# Patient Record
Sex: Male | Born: 1955 | Race: White | Hispanic: No | Marital: Single | Smoking: Never smoker
Health system: Southern US, Academic
[De-identification: ages and names within clinical notes are randomized; demographics above are authoritative.]

## PROBLEM LIST (undated history)

## (undated) DIAGNOSIS — I4891 Unspecified atrial fibrillation: Secondary | ICD-10-CM

## (undated) DIAGNOSIS — M069 Rheumatoid arthritis, unspecified: Secondary | ICD-10-CM

## (undated) DIAGNOSIS — I10 Essential (primary) hypertension: Secondary | ICD-10-CM

## (undated) DIAGNOSIS — M549 Dorsalgia, unspecified: Secondary | ICD-10-CM

## (undated) DIAGNOSIS — G8929 Other chronic pain: Secondary | ICD-10-CM

## (undated) DIAGNOSIS — M1990 Unspecified osteoarthritis, unspecified site: Secondary | ICD-10-CM

## (undated) HISTORY — PX: TONSILLECTOMY: SUR1361

## (undated) HISTORY — PX: KNEE SURGERY: SHX244

---

## 2007-03-20 ENCOUNTER — Encounter: Payer: Self-pay | Admitting: Internal Medicine

## 2007-03-20 ENCOUNTER — Ambulatory Visit: Payer: Self-pay | Admitting: Internal Medicine

## 2007-03-20 DIAGNOSIS — F528 Other sexual dysfunction not due to a substance or known physiological condition: Secondary | ICD-10-CM | POA: Insufficient documentation

## 2007-03-20 DIAGNOSIS — I10 Essential (primary) hypertension: Secondary | ICD-10-CM | POA: Insufficient documentation

## 2007-03-20 DIAGNOSIS — E785 Hyperlipidemia, unspecified: Secondary | ICD-10-CM | POA: Insufficient documentation

## 2007-03-20 LAB — CONVERTED CEMR LAB
ALT: 23 units/L (ref 0–53)
AST: 36 units/L (ref 0–37)
Basophils Relative: 0.2 % (ref 0.0–1.0)
Bilirubin, Direct: 0.1 mg/dL (ref 0.0–0.3)
CO2: 29 meq/L (ref 19–32)
Calcium: 9.3 mg/dL (ref 8.4–10.5)
Eosinophils Relative: 0.1 % (ref 0.0–5.0)
GFR calc Af Amer: 114 mL/min
Glucose, Bld: 86 mg/dL (ref 70–99)
HCT: 39.1 % (ref 39.0–52.0)
Hemoglobin: 14.1 g/dL (ref 13.0–17.0)
Lymphocytes Relative: 26.2 % (ref 12.0–46.0)
Neutro Abs: 4 10*3/uL (ref 1.4–7.7)
Neutrophils Relative %: 65.3 % (ref 43.0–77.0)
Platelets: 168 10*3/uL (ref 150–400)
Total Protein: 7.1 g/dL (ref 6.0–8.3)
Triglycerides: 80 mg/dL (ref 0–149)
VLDL: 16 mg/dL (ref 0–40)
WBC: 6.1 10*3/uL (ref 4.5–10.5)

## 2009-09-28 ENCOUNTER — Telehealth (INDEPENDENT_AMBULATORY_CARE_PROVIDER_SITE_OTHER): Payer: Self-pay

## 2010-10-03 NOTE — Progress Notes (Signed)
Summary: REFILL- denied- needs appt!  Phone Note Refill Request Message from:  Fax from Pharmacy  Refills Requested: Medication #1:  AMLODIPINE BESYLATE 10 MG  TABS   Last Refilled: 08/28/2009 [phone]     [phone]  Initial call taken by: Warnell Forester,  September 28, 2009 10:13 AM  Follow-up for Phone Call        will fax back denied---needs ov.  Thanks. Follow-up by: Warnell Forester,  September 28, 2009 10:50 AM

## 2011-09-22 ENCOUNTER — Emergency Department (INDEPENDENT_AMBULATORY_CARE_PROVIDER_SITE_OTHER): Payer: BC Managed Care – PPO

## 2011-09-22 ENCOUNTER — Emergency Department (HOSPITAL_BASED_OUTPATIENT_CLINIC_OR_DEPARTMENT_OTHER)
Admission: EM | Admit: 2011-09-22 | Discharge: 2011-09-22 | Disposition: A | Discharge status: Home / Self Care | Payer: BC Managed Care – PPO | Attending: Emergency Medicine | Admitting: Emergency Medicine

## 2011-09-22 ENCOUNTER — Encounter (HOSPITAL_BASED_OUTPATIENT_CLINIC_OR_DEPARTMENT_OTHER): Payer: Self-pay | Admitting: Emergency Medicine

## 2011-09-22 DIAGNOSIS — S99929A Unspecified injury of unspecified foot, initial encounter: Secondary | ICD-10-CM | POA: Insufficient documentation

## 2011-09-22 DIAGNOSIS — Y9329 Activity, other involving ice and snow: Secondary | ICD-10-CM | POA: Insufficient documentation

## 2011-09-22 DIAGNOSIS — M25569 Pain in unspecified knee: Secondary | ICD-10-CM

## 2011-09-22 DIAGNOSIS — W010XXA Fall on same level from slipping, tripping and stumbling without subsequent striking against object, initial encounter: Secondary | ICD-10-CM | POA: Insufficient documentation

## 2011-09-22 DIAGNOSIS — M25469 Effusion, unspecified knee: Secondary | ICD-10-CM | POA: Insufficient documentation

## 2011-09-22 DIAGNOSIS — G8929 Other chronic pain: Secondary | ICD-10-CM | POA: Insufficient documentation

## 2011-09-22 DIAGNOSIS — S8990XA Unspecified injury of unspecified lower leg, initial encounter: Secondary | ICD-10-CM | POA: Insufficient documentation

## 2011-09-22 DIAGNOSIS — M549 Dorsalgia, unspecified: Secondary | ICD-10-CM

## 2011-09-22 DIAGNOSIS — M7989 Other specified soft tissue disorders: Secondary | ICD-10-CM

## 2011-09-22 DIAGNOSIS — W19XXXA Unspecified fall, initial encounter: Secondary | ICD-10-CM

## 2011-09-22 DIAGNOSIS — Z8739 Personal history of other diseases of the musculoskeletal system and connective tissue: Secondary | ICD-10-CM | POA: Insufficient documentation

## 2011-09-22 DIAGNOSIS — I10 Essential (primary) hypertension: Secondary | ICD-10-CM | POA: Insufficient documentation

## 2011-09-22 HISTORY — DX: Essential (primary) hypertension: I10

## 2011-09-22 HISTORY — DX: Unspecified osteoarthritis, unspecified site: M19.90

## 2011-09-22 HISTORY — DX: Dorsalgia, unspecified: M54.9

## 2011-09-22 HISTORY — DX: Other chronic pain: G89.29

## 2011-09-22 MED ORDER — ONDANSETRON HCL 4 MG/2ML IJ SOLN
4.0000 mg | Freq: Once | INTRAMUSCULAR | Status: AC
  Administered 2011-09-22: 4 mg via INTRAMUSCULAR
  Filled 2011-09-22: qty 2

## 2011-09-22 MED ORDER — HYDROMORPHONE HCL PF 2 MG/ML IJ SOLN
2.0000 mg | Freq: Once | INTRAMUSCULAR | Status: AC
  Administered 2011-09-22: 2 mg via INTRAMUSCULAR
  Filled 2011-09-22: qty 1

## 2011-09-22 MED ORDER — OXYCODONE-ACETAMINOPHEN 5-325 MG PO TABS: 2.0000 | ORAL_TABLET | ORAL | Status: AC | PRN

## 2011-09-22 NOTE — ED Notes (Signed)
Patient was working outside and slipped and fell on back, twisted R knee, c/o R knee pain and lower back pain

## 2011-09-22 NOTE — ED Notes (Signed)
Patient states that when he fell no LOC, was able to get up and walk back to house with some difficulty

## 2011-09-22 NOTE — ED Provider Notes (Signed)
History/physical exam/procedure(s) were performed by non-physician practitioner and as supervising physician I was immediately available for consultation/collaboration. I have reviewed all notes and am in agreement with care and plan.   Hilario Quarry, MD 09/22/11 [phone]

## 2011-09-22 NOTE — ED Provider Notes (Signed)
History     CSN: [id number]  Arrival date & time 09/22/11  1547   First MD Initiated Contact with Patient 09/22/11 1647      Chief Complaint  Patient presents with  . Fall  . Knee Pain  . Back Pain    (Consider location/radiation/quality/duration/timing/severity/associated sxs/prior treatment) Patient is a 56 y.o. male presenting with knee pain. The history is provided by the patient. No language interpreter was used.  Knee Pain This is a new problem. The current episode started 1 to 4 weeks ago. The problem occurs constantly. The problem has been gradually worsening. Associated symptoms include joint swelling and myalgias. The symptoms are aggravated by twisting and walking. He has tried nothing for the symptoms. The treatment provided moderate relief.   Pt reports he slipped and fell on ice and hit knee.   Past Medical History  Diagnosis Date  . Hypertension   . Arthritis   . Chronic back pain     Past Surgical History  Procedure Date  . Tonsillectomy     History reviewed. No pertinent family history.  History  Substance Use Topics  . Smoking status: Never Smoker   . Smokeless tobacco: Not on file  . Alcohol Use: No      Review of Systems  Musculoskeletal: Positive for myalgias, back pain and joint swelling.  All other systems reviewed and are negative.    Allergies  Penicillins  Home Medications   Current Outpatient Rx  Name Route Sig Dispense Refill  . FOLIC ACID 1 MG PO TABS Oral Take 1 mg by mouth daily.    Marland Kitchen LISINOPRIL-HYDROCHLOROTHIAZIDE 20-25 MG PO TABS Oral Take 1 tablet by mouth daily.    Marland Kitchen PREDNISONE 5 MG PO TABS Oral Take 5-10 mg by mouth daily.    . VENLAFAXINE HCL ER 75 MG PO CP24 Oral Take 75 mg by mouth 3 (three) times daily.      BP 153/89  Pulse 84  Temp(Src) 97.3 F (36.3 C) (Oral)  Resp 18  SpO2 100%  Physical Exam  Nursing note and vitals reviewed. Constitutional: He appears well-developed and well-nourished.  HENT:    Head: Normocephalic.  Right Ear: External ear normal.  Left Ear: External ear normal.  Nose: Nose normal.  Mouth/Throat: Oropharynx is clear and moist.  Eyes: Conjunctivae and EOM are normal. Pupils are equal, round, and reactive to light.  Neck: Normal range of motion. Neck supple.  Cardiovascular: Normal rate.   Pulmonary/Chest: Effort normal.  Abdominal: Soft.  Musculoskeletal: Normal range of motion.  Neurological: He is alert.  Skin: Skin is warm.  Psychiatric: He has a normal mood and affect.    ED Course  Procedures (including critical care time)  Labs Reviewed - No data to display Dg Knee Complete 4 Views Right  09/22/2011  *RADIOLOGY REPORT*  Clinical Data: Right knee pain and swelling status post fall.  RIGHT KNEE - COMPLETE 4+ VIEW  Comparison: None.  Findings: There is a moderate sized knee joint effusion.  Speckled calcifications superior to the patella could reflect small loose bodies.  Alternately, this could represent  small avulsion fractures related to injury of the distal quadriceps tendon. There is degenerative spurring at the quadriceps insertion.  There are mild tricompartmental degenerative changes.  There is no other evidence of acute fracture or dislocation.  IMPRESSION:  1.  Knee joint effusion with possible loose bodies in the suprapatellar bursa. 2.  Alternately, findings could represent avulsion of the distal quadriceps tendon - correlate  clinically.  Original Report Authenticated By: Gerrianne Scale, M.D.     No diagnosis found.    MDM  I suspect quadracep injury.  Pt placed in knee imbolizer.  Pt referred to Dr. Ave Filter for evaluation.         Langston Masker, [place] 09/22/11 1927

## 2011-09-22 NOTE — ED Notes (Signed)
I placed a call to the orthopedic doctor on call per Trisha Mangle NP, the call was returned by Dr Ave Filter @ 1938 pm for consult

## 2012-06-07 IMAGING — CR DG LUMBAR SPINE COMPLETE 4+V
5 series · 5 of 5 positions shown · non-contrast
Comparison: None.

CLINICAL DATA: Status post fall.  Back pain.

LUMBAR SPINE - COMPLETE 4+ VIEW

[t l-spine a.p.]
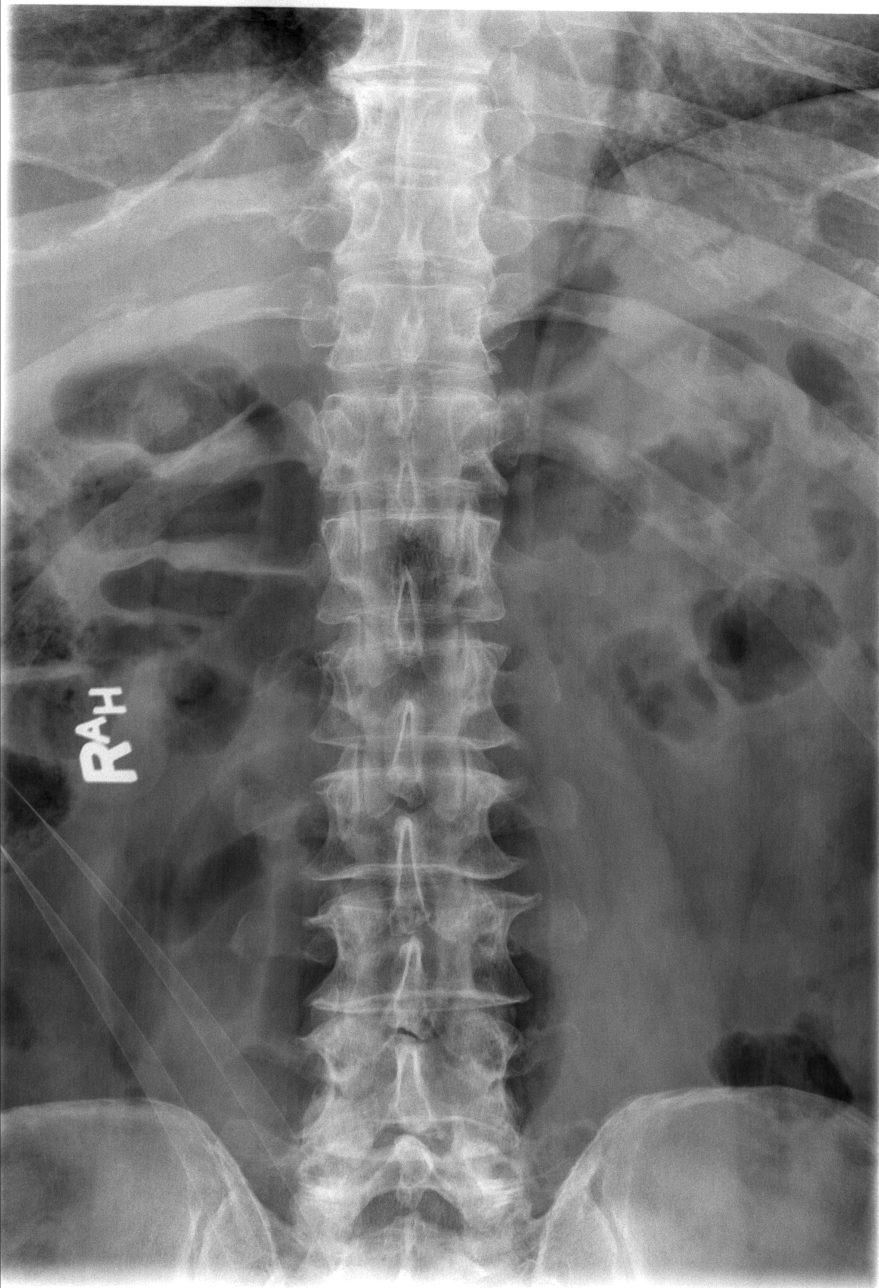

[t l-spine oblique exposure (1 of 2)]
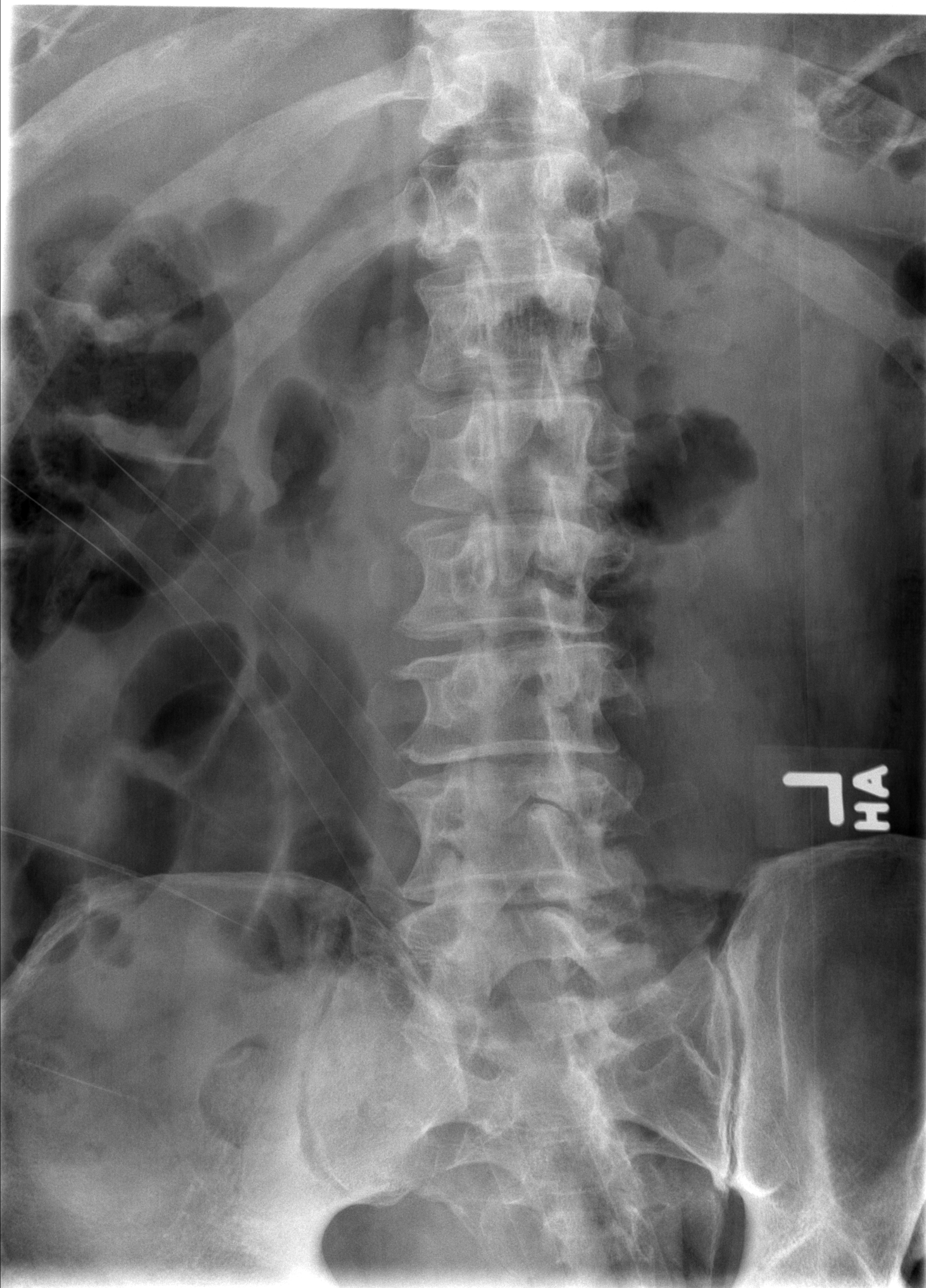

[t l-spine oblique exposure (2 of 2)]
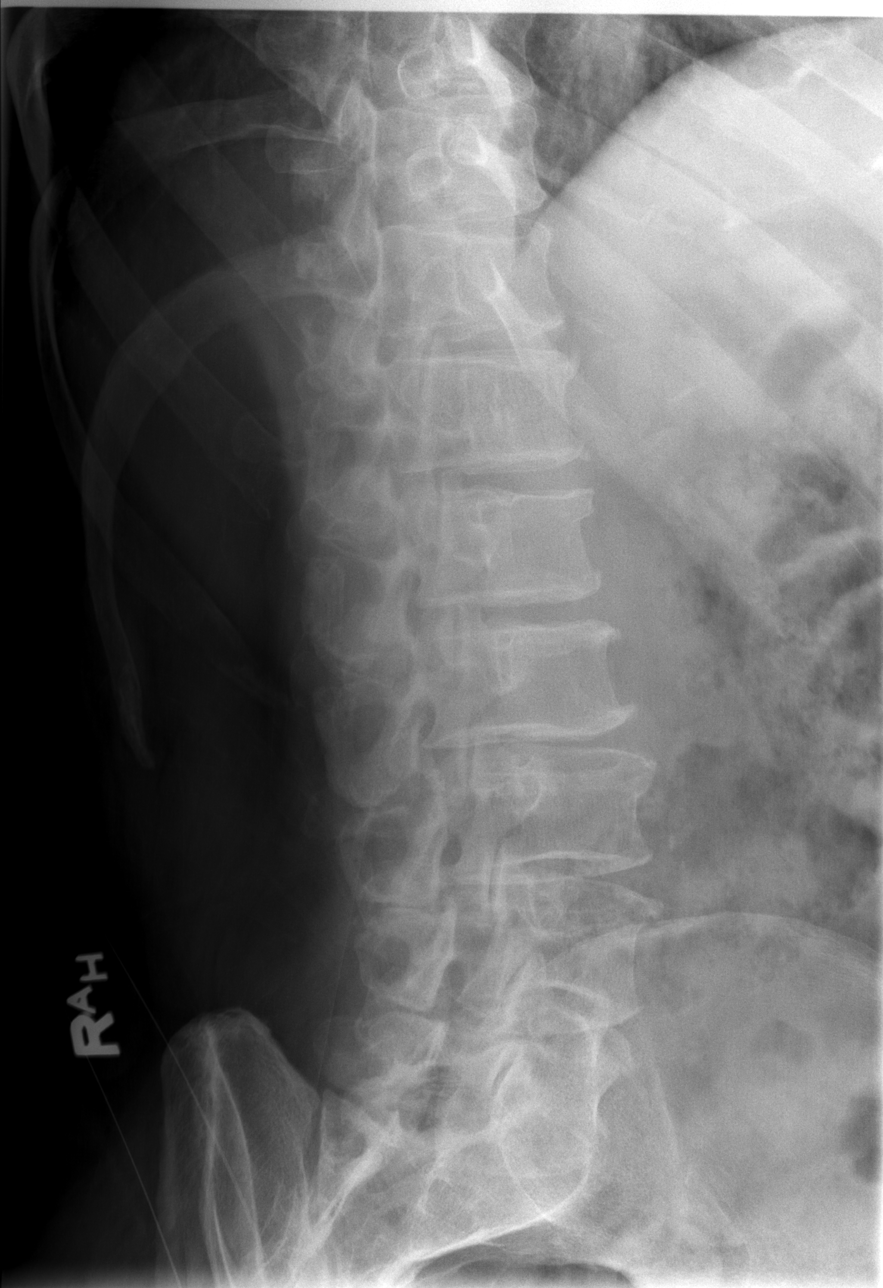

[t l-spine lat]
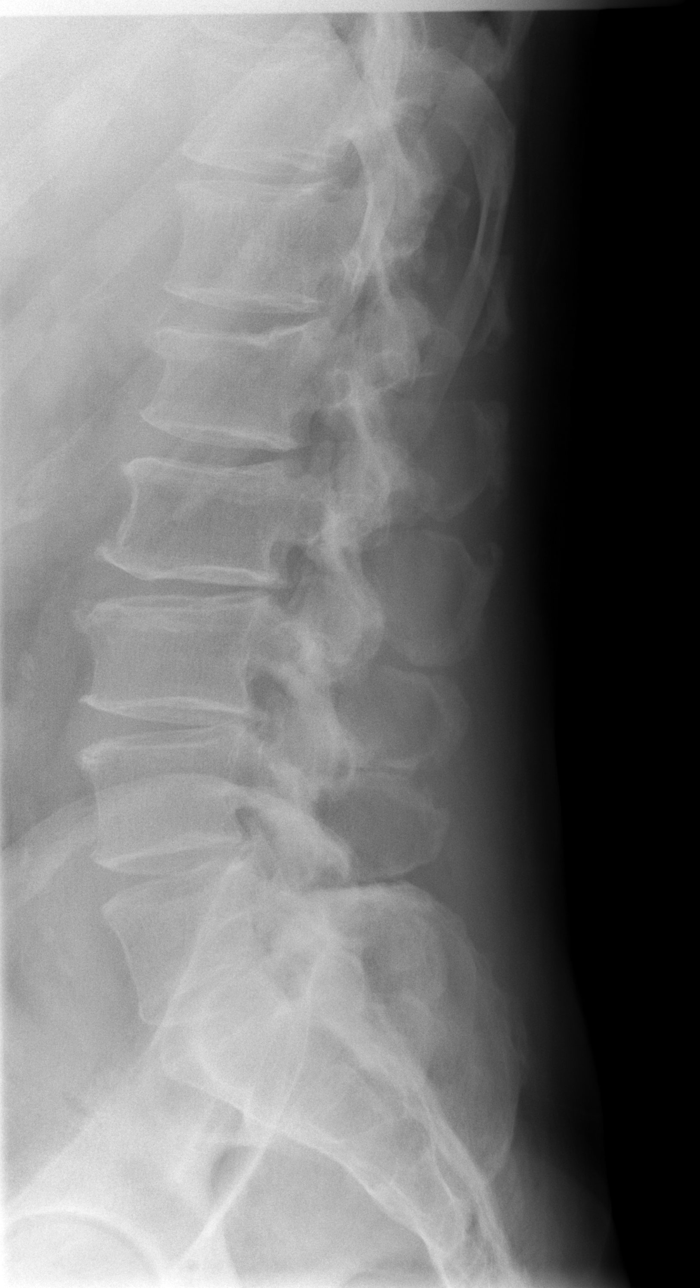

[t l-spine l5-s1 spot]
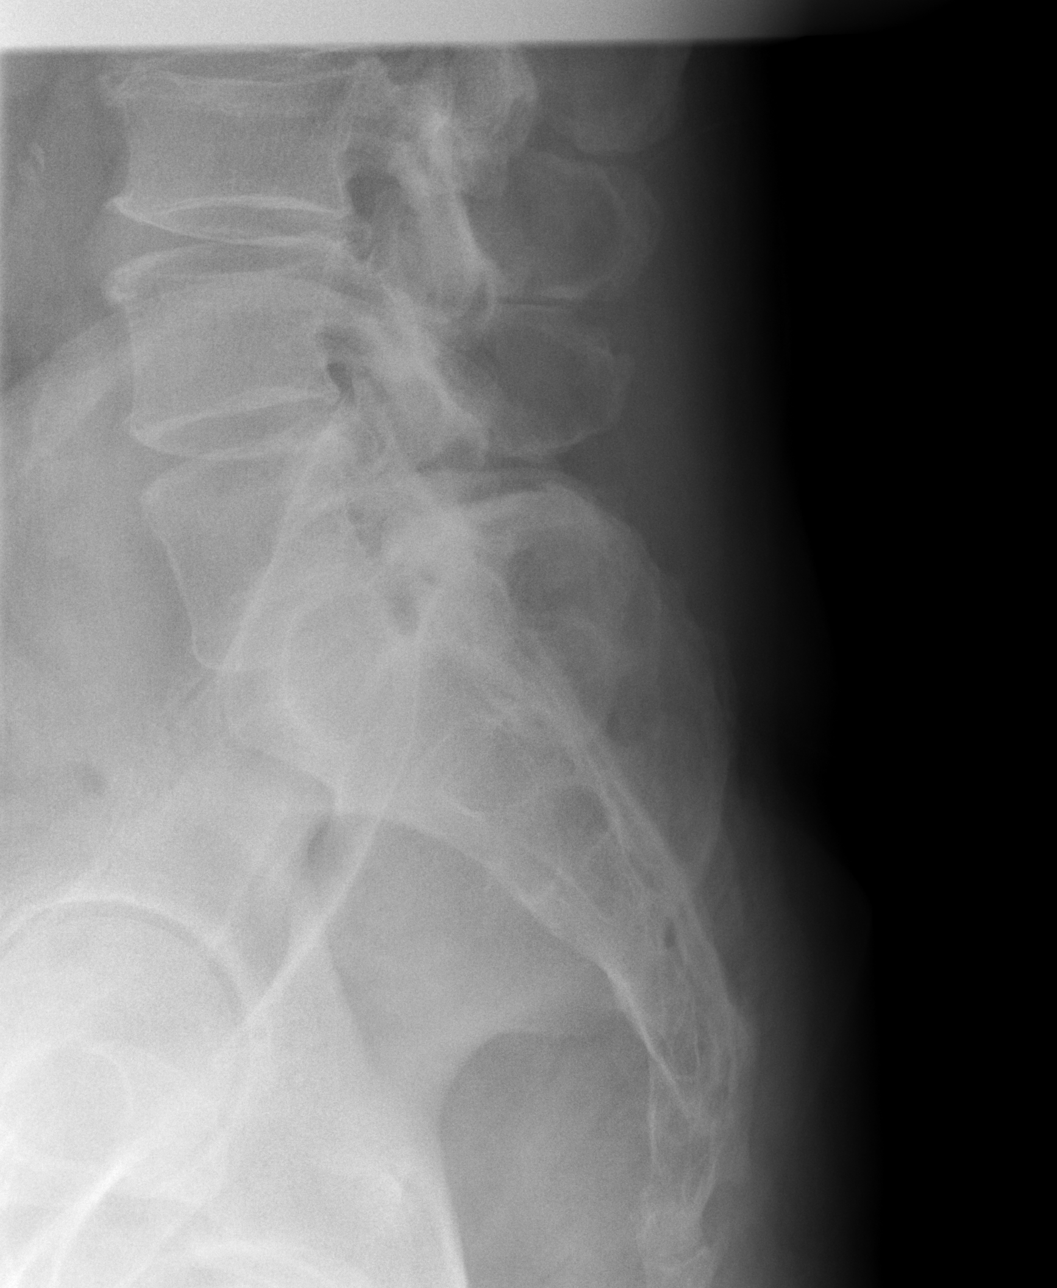

[5 of 5 positions shown; findings below may reference images not displayed]

FINDINGS: The patient has a mild superior endplate compression
fracture of L1 with vertebral body height loss estimated at 15%.
The fracture appears to be remote but cannot be definitively
characterized.  No other fracture is seen.  Multilevel degenerative
disease with loss of disc space height and facet arthropathy is
noted.
IMPRESSION: 1.  Mild, remote appearing superior endplate compression fracture
of L1 cannot be definitively characterized.
2.  Multilevel degenerative change.

## 2017-10-01 ENCOUNTER — Encounter (HOSPITAL_COMMUNITY): Payer: Self-pay

## 2017-10-01 ENCOUNTER — Inpatient Hospital Stay
Admission: EM | Admit: 2017-10-01 | Discharge: 2017-10-04 | DRG: 310 | Disposition: A | Discharge status: Home / Self Care | Payer: 59 | Attending: Internal Medicine | Admitting: Internal Medicine

## 2017-10-01 ENCOUNTER — Inpatient Hospital Stay (HOSPITAL_COMMUNITY): Payer: 59 | Admitting: Hospitalist

## 2017-10-01 ENCOUNTER — Inpatient Hospital Stay (HOSPITAL_COMMUNITY): Payer: 59

## 2017-10-01 ENCOUNTER — Emergency Department (HOSPITAL_COMMUNITY): Payer: 59 | Admitting: Radiology

## 2017-10-01 ENCOUNTER — Other Ambulatory Visit

## 2017-10-01 DIAGNOSIS — Z79899 Other long term (current) drug therapy: Secondary | ICD-10-CM

## 2017-10-01 DIAGNOSIS — M069 Rheumatoid arthritis, unspecified: Secondary | ICD-10-CM

## 2017-10-01 DIAGNOSIS — F0390 Unspecified dementia without behavioral disturbance: Secondary | ICD-10-CM | POA: Diagnosis present

## 2017-10-01 DIAGNOSIS — I499 Cardiac arrhythmia, unspecified: Secondary | ICD-10-CM

## 2017-10-01 DIAGNOSIS — I471 Supraventricular tachycardia: Secondary | ICD-10-CM

## 2017-10-01 DIAGNOSIS — I4892 Unspecified atrial flutter: Principal | ICD-10-CM | POA: Diagnosis present

## 2017-10-01 DIAGNOSIS — I4891 Unspecified atrial fibrillation: Secondary | ICD-10-CM | POA: Diagnosis present

## 2017-10-01 DIAGNOSIS — I10 Essential (primary) hypertension: Secondary | ICD-10-CM | POA: Diagnosis present

## 2017-10-01 DIAGNOSIS — R9431 Abnormal electrocardiogram [ECG] [EKG]: Secondary | ICD-10-CM

## 2017-10-01 HISTORY — DX: Rheumatoid arthritis, unspecified: M06.9

## 2017-10-01 HISTORY — DX: Unspecified atrial fibrillation (CMS HCC): I48.91

## 2017-10-01 LAB — CBC WITH DIFF
BASOPHIL #: 0 x10ˆ3/uL (ref 0.00–0.20)
BASOPHIL %: 1 %
EOSINOPHIL #: 0.1 x10ˆ3/uL (ref 0.00–0.50)
EOSINOPHIL %: 1 %
HCT: 44.5 % (ref 38.9–50.5)
HGB: 15.4 g/dL (ref 13.4–17.3)
LYMPHOCYTE #: 1.9 x10ˆ3/uL (ref 0.80–3.20)
LYMPHOCYTE %: 19 %
MCH: 33.3 pg — ABNORMAL HIGH (ref 27.9–33.1)
MCHC: 34.7 g/dL (ref 32.8–36.0)
MCV: 96 fL — ABNORMAL HIGH (ref 82.4–95.0)
MONOCYTE #: 1 10*3/uL — ABNORMAL HIGH (ref 0.20–0.80)
MONOCYTE %: 10 %
MPV: 9 fL (ref 6.0–10.2)
NEUTROPHIL #: 7 x10ˆ3/uL — ABNORMAL HIGH (ref 1.60–5.50)
NEUTROPHIL %: 69 %
PLATELETS: 196 x10ˆ3/uL (ref 140–440)
RBC: 4.64 x10ˆ6/uL (ref 4.40–5.68)
RDW: 13.8 % (ref 10.9–15.1)
WBC: 10.1 x10ˆ3/uL — ABNORMAL HIGH (ref 3.3–9.3)

## 2017-10-01 LAB — BASIC METABOLIC PANEL
ANION GAP: 5 mmol/L
BUN/CREA RATIO: 24
BUN: 19 mg/dL (ref 10–25)
CALCIUM: 9.4 mg/dL (ref 8.8–10.3)
CHLORIDE: 103 mmol/L (ref 98–111)
CO2 TOTAL: 26 mmol/L (ref 21–35)
CREATININE: 0.78 mg/dL (ref <=1.30)
ESTIMATED GFR: >60 mL/min/1.73mˆ2
GLUCOSE: 107 mg/dL (ref 70–110)
POTASSIUM: 4.2 mmol/L (ref 3.5–5.0)
SODIUM: 134 mmol/L — ABNORMAL LOW (ref 135–145)

## 2017-10-01 LAB — TROPONIN-I
TROPONIN I: 0.01 ng/mL (ref <=0.04)
TROPONIN I: 0.01 ng/mL (ref <=0.04)

## 2017-10-01 LAB — THYROID STIMULATING HORMONE (SENSITIVE TSH): TSH: 1.594 u[IU]/mL (ref 0.450–5.330)

## 2017-10-01 LAB — PT/INR: INR: 1.11 — ABNORMAL HIGH (ref 0.80–1.10)

## 2017-10-01 MED ORDER — SODIUM CHLORIDE 0.9 % (FLUSH) INJECTION SYRINGE
3.0000 mL | INJECTION | Freq: Three times a day (TID) | INTRAMUSCULAR | Status: DC
Start: 2017-10-01 — End: 2017-10-04
  Administered 2017-10-01: 0 mL
  Administered 2017-10-01 – 2017-10-02 (×2): 3 mL
  Administered 2017-10-02 (×2): 0 mL
  Administered 2017-10-03: 3 mL
  Administered 2017-10-03: 0 mL
  Administered 2017-10-03 – 2017-10-04 (×2): 3 mL

## 2017-10-01 MED ORDER — VENLAFAXINE ER 75 MG CAPSULE,EXTENDED RELEASE 24 HR
150.0000 mg | ORAL_CAPSULE | Freq: Every day | ORAL | Status: DC
Start: 2017-10-01 — End: 2017-10-04
  Administered 2017-10-01: 0 mg via ORAL
  Administered 2017-10-02 – 2017-10-04 (×3): 150 mg via ORAL
  Filled 2017-10-01 (×6): qty 2

## 2017-10-01 MED ORDER — LISINOPRIL 20 MG TABLET
20.0000 mg | ORAL_TABLET | Freq: Every day | ORAL | Status: DC
Start: 2017-10-01 — End: 2017-10-04
  Administered 2017-10-01: 0 mg via ORAL
  Administered 2017-10-02 – 2017-10-04 (×3): 20 mg via ORAL
  Filled 2017-10-01 (×5): qty 1

## 2017-10-01 MED ORDER — ENOXAPARIN 120 MG/0.8 ML SUBCUTANEOUS SYRINGE
1.0000 mg/kg | INJECTION | Freq: Two times a day (BID) | SUBCUTANEOUS | Status: DC
Start: 2017-10-01 — End: 2017-10-04
  Administered 2017-10-01 – 2017-10-04 (×5): 110 mg via SUBCUTANEOUS
  Filled 2017-10-01 (×8): qty 0.8

## 2017-10-01 MED ORDER — DILTIAZEM 5 MG/ML INTRAVENOUS SOLUTION
10.00 mg | INTRAVENOUS | Status: AC
Start: 2017-10-01 — End: 2017-10-01
  Administered 2017-10-01: 10 mg via INTRAVENOUS
  Filled 2017-10-01: qty 5

## 2017-10-01 MED ORDER — ACETAMINOPHEN 325 MG TABLET
650.0000 mg | ORAL_TABLET | Freq: Four times a day (QID) | ORAL | Status: DC | PRN
Start: 2017-10-01 — End: 2017-10-04

## 2017-10-01 MED ORDER — SODIUM CHLORIDE 0.9 % (FLUSH) INJECTION SYRINGE
3.0000 mL | INJECTION | INTRAMUSCULAR | Status: DC | PRN
Start: 2017-10-01 — End: 2017-10-04

## 2017-10-01 MED ORDER — DILTIAZEM 5 MG/ML INTRAVENOUS SOLUTION
5.00 mg/h | INTRAVENOUS | Status: DC
Start: 2017-10-01 — End: 2017-10-03
  Administered 2017-10-01: 5 mg/h via INTRAVENOUS
  Administered 2017-10-01: 15 mg/h via INTRAVENOUS
  Administered 2017-10-01 (×2): 5 mg/h via INTRAVENOUS
  Administered 2017-10-02: 10 mg/h via INTRAVENOUS
  Administered 2017-10-02: 5 mg/h via INTRAVENOUS
  Administered 2017-10-02: 15 mg/h via INTRAVENOUS
  Administered 2017-10-03: 0 mg/h via INTRAVENOUS
  Administered 2017-10-03: 5 mg/h via INTRAVENOUS
  Administered 2017-10-03: 10 mg/h via INTRAVENOUS
  Filled 2017-10-01 (×5): qty 25

## 2017-10-01 MED ORDER — ONDANSETRON HCL (PF) 4 MG/2 ML INJECTION SOLUTION
4.0000 mg | Freq: Four times a day (QID) | INTRAMUSCULAR | Status: DC | PRN
Start: 2017-10-01 — End: 2017-10-04

## 2017-10-01 MED ORDER — ADENOSINE 3 MG/ML INTRAVENOUS SOLUTION
6.00 mg | INTRAVENOUS | Status: DC
Start: 2017-10-01 — End: 2017-10-01
  Filled 2017-10-01: qty 2

## 2017-10-01 MED ORDER — DILTIAZEM 5 MG/ML INTRAVENOUS SOLUTION
10.00 mg | INTRAVENOUS | Status: AC
Start: 2017-10-01 — End: 2017-10-01
  Administered 2017-10-01 (×2): 10 mg via INTRAVENOUS

## 2017-10-01 MED ADMIN — sodium hypochlorite 0.5 % solution: @ 22:00:00 | NDC 00436094616

## 2017-10-01 NOTE — ED Nurses Note (Signed)
Pt transported to 7123 via cart by PCA. IV shows no signs of infiltration. All personal belongings with pt at time of transport. Pt family aware of room placement. Report called to [place] from [facility] prior to transport.

## 2017-10-01 NOTE — H&P (Signed)
[facility]  Hospitalist Admission H&P    [name], [name], 62 y.o. male  Encounter Start Date:  10/01/2017  Inpatient Admission Date:  10/01/2017  Date of Birth:  May 25, 1956    Information Obtained from: patient and history reviewed via medical record  Chief Complaint:  Chest pain and heavy breathing    HPI: [name] [name] is a 62 y.o., White male who presents with pain across his chest and what he is describing is heavy breathing but not necessarily shortness of breath.  The patient does have a history of AFib in the past and has been on Eliquis.  The patient has apparently been converted and then controlled with diltiazem.  He is not currently on any anticoagulation.  He is from [place] where his cardiologist is.  He is currently in the area doing pipeline work.  The patient did report that on Sunday he started feeling heaviness in his chest.  He is denying any actual palpitations that he has noticed.  The the patient is denying any nausea or vomiting.  He is denying any fevers or chills.  He denies any bowel or bladder changes.  All other review of systems are negative except as stated in HPI.    Past Medical History:   Diagnosis Date   . Atrial fibrillation (CMS HCC)    . RA (rheumatoid arthritis) (CMS HCC)          Past Surgical History:   Procedure Laterality Date   . KNEE SURGERY           Medications Prior to Admission     Prescriptions    dilTIAZem (CARDIZEM) 60 mg Oral Tablet    Take 120 mg by mouth Once a day     lisinopril (PRINIVIL) 10 mg Oral Tablet    Take 20 mg by mouth Once a day     methotrexate 2.5 mg tablet    Take by mouth Every 7 days    venlafaxine (EFFEXOR) 100 mg Oral Tablet    Take 100 mg by mouth Once a day     vitamin B complex Oral Tablet    Take 1 Tab by mouth Once a day        Allergies   Allergen Reactions   . Penicillins      Social History     Socioeconomic History   . Marital status: Single     Spouse name: Not on file   . Number of children: Not on file   . Years of  education: Not on file   . Highest education level: Not on file   Social Needs   . Financial resource strain: Not on file   . Food insecurity - worry: Not on file   . Food insecurity - inability: Not on file   . Transportation needs - medical: Not on file   . Transportation needs - non-medical: Not on file   Occupational History   . Not on file   Tobacco Use   . Smoking status: Never Smoker   . Smokeless tobacco: Never Used   Substance and Sexual Activity   . Alcohol use: Never     Frequency: Never   . Drug use: Never   . Sexual activity: Not on file   Other Topics Concern   . Not on file   Social History Narrative   . Not on file     Past Family History:     CAD  AFib  Hypertension  Dementia  ROS: Other than ROS in the HPI, all other systems were negative.    EXAM:  Temperature: 37.3 C (99.1 F)  Heart Rate: 95  BP (Non-Invasive): 119/75  Respiratory Rate: 14  SpO2-1: 95 %  Pain Score (Numeric, Faces): 0  Constitutional:  In bed, alert, oriented, cooperative, no distress  Respiratory:  Moderate to good air entry, clear to auscultation bilaterally  Cardiovascular:  Irregular and mildly tachy  Gastrointestinal:  Bowel sounds positive, abdomen soft, nontender  Extremities:  No edema, no erythema  Integumentary:  Warm and dry  Neurologic:  Grossly normal, motor intact    LABS:    Lab Results Today:    Results for orders placed or performed during the hospital encounter of 10/01/17 (from the past 24 hour(s))   ECG 12 LEAD - ED USE   Result Value Ref Range    Heart Rate 136 BPM    PR Interval 97 ms    QRS Duration 91 ms    QT Interval 322 ms    QTC Calculation 485 ms    Calculated P Axis 263 deg    QRS Axis 57 deg    Calculated T Axis 12 deg    I 40 Axis 40 deg    T 40 Axis 68 deg    ST Axis -81 deg    EKG Severity - ABNORMAL ECG -    BASIC METABOLIC PANEL   Result Value Ref Range    SODIUM 134 (L) 135 - 145 mmol/L    POTASSIUM 4.2 3.5 - 5.0 mmol/L    CHLORIDE 103 98 - 111 mmol/L    CO2 TOTAL 26 21 - 35 mmol/L     ANION GAP 5 mmol/L    CALCIUM 9.4 8.8 - 10.3 mg/dL    GLUCOSE 063 70 - 016 mg/dL    BUN 19 10 - 25 mg/dL    CREATININE 0.10 <=9.32 mg/dL    BUN/CREA RATIO 24     ESTIMATED GFR >60 Avg: 85 mL/min/1.45m^2   TROPONIN-I   Result Value Ref Range    TROPONIN I 0.01 <=0.04 ng/mL   PT/INR   Result Value Ref Range    INR 1.11 (H) 0.80 - 1.10   CBC WITH DIFF   Result Value Ref Range    WBC 10.1 (H) 3.3 - 9.3 x10^3/uL    RBC 4.64 4.40 - 5.68 x10^6/uL    HGB 15.4 13.4 - 17.3 g/dL    HCT 35.5 73.2 - 20.2 %    MCV 96.0 (H) 82.4 - 95.0 fL    MCH 33.3 (H) 27.9 - 33.1 pg    MCHC 34.7 32.8 - 36.0 g/dL    RDW 54.2 70.6 - 23.7 %    PLATELETS 196 140 - 440 x10^3/uL    MPV 9.0 6.0 - 10.2 fL    NEUTROPHIL % 69 %    LYMPHOCYTE % 19 %    MONOCYTE % 10 %    EOSINOPHIL % 1 %    BASOPHIL % 1 %    NEUTROPHIL # 7.00 (H) 1.60 - 5.50 x10^3/uL    LYMPHOCYTE # 1.90 0.80 - 3.20 x10^3/uL    MONOCYTE # 1.00 (H) 0.20 - 0.80 x10^3/uL    EOSINOPHIL # 0.10 0.00 - 0.50 x10^3/uL    BASOPHIL # 0.00 0.00 - 0.20 x10^3/uL   ECG 12 LEAD - ED USE   Result Value Ref Range    Heart Rate 111 BPM    PR Interval Invalid ms  QRS Duration 114 ms    QT Interval 339 ms    QTC Calculation 461 ms    Calculated P Axis Invalid deg    QRS Axis 49 deg    Calculated T Axis -12 deg    I 40 Axis 30 deg    T 40 Axis 79 deg    ST Axis 269 deg    EKG Severity - ABNORMAL ECG -        Radiology Results:     Results for orders placed or performed during the hospital encounter of 10/01/17   XR AP MOBILE CHEST     Status: None    Narrative    [name] [name]    PROCEDURE DESCRIPTION: XR AP MOBILE CHEST    PROCEDURE PERFORMED DATE AND TIME: 10/01/2017 12:52 PM    CLINICAL INDICATION: chest pain    TECHNIQUE: 1 views / 2 images submitted.    COMPARISON: No prior studies were compared.      FINDINGS: AP upright portable chest shows lungs to be well expanded and  clear. Cardiac silhouette is within normal limits. No pulmonary vascular  congestion seen. Bones appear intact.      Impression    No  acute cardiopulmonary process.             There is no immunization history on file for this patient.    DNR Status:  Full Code      ASSESSMENT/PLAN:    Active Hospital Problems    Diagnosis   . Atrial flutter (CMS HCC)  He does have a history of AFib but has been in sinus and off of anticoagulation  Patient has been on Eliquis in the past  Cardizem drip  Lovenox 1 milligram/kilos  Trend troponins  Cardiology is consulted  Echocardiogram ordered  Will add TSH to blood in lab  Lipid panel in a.m.   Marland Kitchen Rheumatoid arthritis  Patient may resume methotrexate at discharge     DVT/PE Prophylaxis: Enoxaparin  Disposition Planning: Home discharge     Rondell Reams, MD  10/01/2017, 16:21

## 2017-10-01 NOTE — ED Provider Notes (Signed)
[facility]  Emergency Department  Provider Note    Name: [name] [name]  Age and Gender: 62 y.o. male  Date of Birth: [birth year]  Date of Service: 10/01/2017  MRN: [id number]  PCP: Vivien Presto, MD  Attending: Raina Mina MD    Chief Complaint   Patient presents with   . Chest Pain     [facility] went to [facility]pitations. [facility] sent him for abnormal EKG showing a-flutter. 324mg  asa given PTA       Clinical Impression:     Encounter Diagnosis   Name Primary?   . Atrial flutter (CMS HCC) Yes       MDM/Course:  [name] [name] is a 62 y.o. male with history of atrial fibrillation not on anticoagulation, and hypertension; who presented with palpitations.     Given patients history, current symptoms, and exam concern for, but not limited to, atrial fibrillation, SVT, atrial flutter, ACS, CHF.   On arrival, patient's vital signs significant for heart rate 137, blood pressure 139/100, respiratory rate 24, oxygen saturation 96% on room air.  On initial examination, patient is nontoxic, in no acute distress.   Patient states that he is currently asymptomatic, but is complaining of previous palpitations.  10 mg Cardizem IV were ordered and administered.   EKG showed narrow complex tachycardia at a rate of 136, most consistent with atrial flutter versus ectopic atrial tachycardia versus SVT.   Lab work was within normal limits, troponin 0.01.   Chest x-ray showed no acute process.   On re-evaluation after 10 mg IV diltiazem, the patient's rate is unchanged, an additional 20 mg IV diltiazem was ordered.  The patient continued to be asymptomatic.   After 2nd dose of 20 mg IV diltiazem, patient had no change in his rate.  We attempted to modified vagal maneuvers, which initially showed no change.  After several minutes though however the patient's rhythm did appear to change.  Repeat EKG was ordered and showed atrial flutter with variable conduction.  Patient was started on a diltiazem drip, cardiology was consulted,  and the patient was admitted to the hospital for further evaluation and management.      Medications given:  Medications   dilTIAZem (CARDIZEM) 125 mg in NS 125 mL (tot vol) infusion (not administered)   dilTIAZem (CARDIZEM) 5 mg/mL injection (10 mg Intravenous Given 10/01/17 1245)   dilTIAZem (CARDIZEM) 5 mg/mL injection (10 mg Intravenous Given 10/01/17 1335)   dilTIAZem (CARDIZEM) 5 mg/mL injection (10 mg Intravenous Given 10/01/17 1344)       Disposition: Admitted  -------------------------------------------------------------------------------------------------------------------------------------------    Arrival: The patient arrived by private car and is alone.  History Obtained by: provider  History Limitations: none    Chief Complaint   Patient presents with   . Chest Pain     Pt went to [facility] for [facility] palpitations. [facility] sent him for abnormal EKG showing a-flutter. 324mg  asa given PTA       HPI:  [name] [name] is a 62 y.o. White male with history of atrial fibrillation not on anticoagulation, and hypertension; presenting with palpitations.    Patient states that this began on Sunday with an uneasy feeling in his chest, it has been constant since that time.  Associated with mild shortness of breath, no diaphoresis.  He stated that the discomfort radiated into his left shoulder.   Patient denies any known cardiac history.  He takes 120 mg extended release diltiazem daily for atrial fibrillation, he states that he recently  saw his cardiologist, and was in a normal rhythm. He had to be cardioverted out of atrial fibrillation once in the past, and was placed on anticoagulation at that time, but was discontinued after approximately 6 months to 1 year.  The patient's cardiologist is in [place].    Patient denies any history of stroke, peripheral artery disease, diabetes, hyperlipidemia, DVT/PE.  No recent surgery or immobilization, no known cancer, no hemoptysis, or pain or swelling in his lower  extremities.    Allergies:  Penicillins  Social history:  Denies tobacco, alcohol, other drug use  No recent changes to his medications.  Not on anticoagulation due to his atrial fibrillation.  Family history:  Father had coronary artery disease, no history of DVT/PE    ROS:  Constitutional: No fever, chills or weakness   Skin: No rash or diaphoresis  HENT: No headaches, or congestion  Eyes: No vision changes or photophobia   Cardio: +chest pain, palpitations, no leg swelling   Respiratory: No cough, wheezing or SOB  GI:  No nausea, vomiting or stool changes  GU:  No dysuria, hematuria, or increased frequency  MSK: No muscle aches, joint or back pain  Neuro: No seizures, LOC, numbness, tingling, or focal weakness  Psychiatric: No depression, SI or substance abuse  All other systems reviewed and are negative.      Below pertinent information reviewed with patient:  Past Medical History:   Diagnosis Date   . Atrial fibrillation (CMS HCC)        Medications Prior to Admission     None          Allergies   Allergen Reactions   . Penicillins        Past Surgical History:   Procedure Laterality Date   . KNEE SURGERY         Family Medical History:     None          Social History     Socioeconomic History   . Marital status: Single     Spouse name: Not on file   . Number of children: Not on file   . Years of education: Not on file   . Highest education level: Not on file   Social Needs   . Financial resource strain: Not on file   . Food insecurity - worry: Not on file   . Food insecurity - inability: Not on file   . Transportation needs - medical: Not on file   . Transportation needs - non-medical: Not on file   Occupational History   . Not on file   Tobacco Use   . Smoking status: Never Smoker   . Smokeless tobacco: Never Used   Substance and Sexual Activity   . Alcohol use: Never     Frequency: Never   . Drug use: Never   . Sexual activity: Not on file   Other Topics Concern   . Not on file   Social History Narrative      . Not on file         Objective:  ED Triage Vitals [10/01/17 1227]   Enc Vitals Group      BP (Non-Invasive) (!) 139/100      Heart Rate (!) 137      Respiratory Rate (!) 24      Temp       Temp src       SpO2-1 96 %      Weight 108.9 kg (  240 lb)      Height 1.88 m (6\' 2" )      Head Circumference       Peak Flow       Pain Score       Pain Loc       Pain Edu?       Excl. in GC?        Nursing notes and vital signs reviewed.    Constitutional: Pleasant 62 y.o. male appears stated age in good health, in no distress, normal color, no cyanosis.   HENT:   Head: Normocephalic and atraumatic.   Mouth/Throat: Oropharynx is clear and moist.   Eyes: EOMI, PERRL   Neck: Trachea midline. Neck supple.  Cardiovascular:   Tachycardic, regular rhythm, No murmurs, rubs or gallops. Intact distal pulses.  Pulmonary/Chest: BS equal bilaterally. No respiratory distress. No wheezes, rales or chest tenderness.   Abdominal: BS +. Abdomen soft, no tenderness, rebound or guarding.  Back: No midline spinal tenderness, no paraspinal tenderness, no CVA tenderness.           Musculoskeletal: No edema, tenderness or deformity.  Skin: warm and dry. No rash, erythema, pallor or cyanosis  Psychiatric: normal mood and affect. Behavior is normal.   Neurological: Patient keenly alert and responsive, CN II-XII grossly intact, moving all extremities equally and fully, normal gait    Labs:   Labs Reviewed   BASIC METABOLIC PANEL - Abnormal; Notable for the following components:       Result Value    SODIUM 134 (*)     All other components within normal limits   PT/INR - Abnormal; Notable for the following components:    INR 1.11 (*)     All other components within normal limits   CBC WITH DIFF - Abnormal; Notable for the following components:    WBC 10.1 (*)     MCV 96.0 (*)     MCH 33.3 (*)     NEUTROPHIL # 7.00 (*)     MONOCYTE # 1.00 (*)     All other components within normal limits   TROPONIN-I - Normal   CBC/DIFF    Narrative:     The following  orders were created for panel order CBC/DIFF.  Procedure                               Abnormality         Status                     ---------                               -----------         ------                     CBC WITH OYDX[[id number]]                Abnormal            Final result                 Please view results for these tests on the individual orders.       Imaging:  XR AP MOBILE CHEST   Final Result   No acute cardiopulmonary process.  EKG:  Ventricular Rate 136, atrial rate 300, narrow complex tachycardia, with inverted T-waves consistent with atrial flutter versus ectopic atrial tachycardia versus SVT.  No ST depressions or elevations, no T-wave inversions.  No previous EKG for comparison.    Repeat EKG:  Ventricular rate 111, atrial rate 300, atrial flutter with variable block, no ST-T elevation or depression, no T-wave inversion.    Patient seen by and discussed with attending physician, Raina Mina MD.    Parts of this patients chart were completed in a retrospective fashion due to simultaneous direct patient care activities in the Emergency Department.   This note was partially generated using MModal Fluency Direct system, and there may be some incorrect words, spellings, and punctuation that were not noted in checking the note before saving.      Willia Craze, MD 10/01/2017 14:14  PGY-1 Emergency Medicine  [facility]

## 2017-10-01 NOTE — ED Attending Note (Signed)
Department of Emergency Medicine   [facility]    Date: 10/01/2017   Time: 2:07 PM    I was physically present and directly supervised this patient's care.  Patient was seen and examined.  The resident's history and exam were reviewed.  Key elements in addition to and/or correction of that documentation are as follows:      Chief Complaint:  Chief Complaint   Patient presents with   . Chest Pain     Pt went to [facility] for chest palpitations. [facility] sent him for abnormal EKG showing a-flutter. 324mg  asa given PTA       History of Present Illness:  [name] is a 62 y.o. male with complaint of palpitations and not feeling well for last 3 days.  Complains of strange sensation is chest which refused to call pain.  Just does not feel normal.  He went to work today and started feeling short of breath.  Arrives here by ambulance.  Was given aspirin ambulance which resolved the strange feeling in his chest.  Patient has history of atrial fibrillation.  Is on Cardizem at home.  Last time he saw his cardiologist was 2 weeks ago, 68.  He was told that everything looked good and he does not need to return to cardiologist anymore.  Patient was anticoagulation for 6 months after he was diagnosed with atrial fibrillation.  For last few years he does not take any anticoagulation.  Apparently he was in sinus rhythm for last few years.      ROS: Otherwise negative, if commented on in the HPI.     PMH, PSH, medications, allergies, SH, and FH per resident's note. Important aspects of these fields pertaining to today's visit taken into consideration during history/physical and MDM.    Physical Exam:     All nurse's notes reviewed.  Please see primary note for physical exam findings. Additional findings of my own are as documented below:    ED Triage Vitals [10/01/17 1227]   Enc Vitals Group      BP (Non-Invasive) (!) 139/100      Heart Rate (!) 137      Respiratory Rate (!) 24      Temperature 36.9 C  (98.4 F)      Temp src       SpO2-1 96 %      Weight 108.9 kg (240 lb)      Height 1.88 m (6\' 2" )      Head Circumference       Peak Flow       Pain Score       Pain Loc       Pain Edu?       Excl. in GC?      Filed Vitals:    10/01/17 1415 10/01/17 1430 10/01/17 1443 10/01/17 1445   BP: 111/64 104/86  (!) 124/95   Pulse: (!) 129 (!) 125 (!) 137 (!) 119   Resp: 17   18   Temp:       SpO2: 95% 95%  99%       Constitutional: This patient was an alert, oriented male in NAD.     HEENT:   Head: Normocephalic and atraumatic.    Ears: NL external ears, no drainage.    Eyes: EOM are normal. No drainage, NL conjunctivae and sclerae.    Nose: No drainage, no epistaxis.    Throat/mouth: Moist MM, throat with no erythema.    Neck:  Normal range of motion. Neck supple. No tracheal deviation present.    Pulmonary/Chest:  CTA. No respiratory distress.    Heart:  Tachycardic, regular.Abdomen: Soft, ND. NL BS. NT.    Musculoskeletal: Normal range of motion. No edema or deformity.    Back: No CVA tenderness.  Skin: Skin is warm and dry.   Neurological: Awake, alert, cooperative. Face symmetrical, speech NL. Moves all 4 extremities.         EKG:  After flutter rate of 140. Normal PR interval. Normal axis. No QT prolongation. No ST changes. No evidence of acute ischemia or injury. No arrhythmia.  No old EKG available for comparison.     MDM:   During the patient's stay in the emergency department, images and/or labs were performed to assist with medical decision making and were reviewed by myself.   Patient was seen and examined with the assistance of Dr. Christell Constant.      Agree with resident's MDM and ED course.    Patient was treated with Cardizem 10 mg IV push with, change in her rhythm.  Hour later was given additional 10 mg with repeat another 10 10 min later.  After that and after Valsalva maneuvers patient slowed down to 118 seen.  Repeat EKG at that time shows atrial flutter with variable conduction.  The cardiologist on-call was  consulted, he recommends admission to the hospital for further workup and anticoagulation.    Impression:   Encounter Diagnosis   Name Primary?   . Atrial flutter (CMS HCC) Yes     Disposition:  Admitted      CRITICAL CARE: Patient presented with tachycardia with heart rate in 130s to 140s.  I spend 49 minutes while the patient was in this condition, providing supportive care, fluids, drugs and oxygen, consulting with cardiologist and then with admitting physician.  I reviewed the resident's documentation and agree with the resident's assessment and plan of care.      Raina Mina, MD  10/01/2017, 14:07    Chart completed after conclusion of patient care due to time constraints of direct patient care during shift

## 2017-10-01 NOTE — Care Management Notes (Signed)
SW consult advised for home d/c needs assessment, case management to f/u on unit.  Colon Flattery, CASE MANAGER  10/01/2017, 14:13

## 2017-10-01 NOTE — Care Plan (Signed)
Received as an admit from the ER. Admitted on a Cardizem drip at 15mg /hr from the ER due to new onset a-flutter. Rate changed to 5mg /hr as heart rate was fluctuating in the high 70's to the low 100's and remained in a-flutter. No complaints of chest pain or shortness of breath. Will continue to monitor.

## 2017-10-02 DIAGNOSIS — I10 Essential (primary) hypertension: Secondary | ICD-10-CM

## 2017-10-02 DIAGNOSIS — I480 Paroxysmal atrial fibrillation: Secondary | ICD-10-CM

## 2017-10-02 LAB — CBC WITH DIFF
BASOPHIL #: 0.1 10*3/uL (ref 0.00–0.20)
BASOPHIL %: 1 %
BASOPHIL %: 1 %
EOSINOPHIL #: 0.2 10*3/uL (ref 0.00–0.50)
EOSINOPHIL %: 2 %
HCT: 43 % (ref 38.9–50.5)
HGB: 15.1 g/dL (ref 13.4–17.3)
LYMPHOCYTE #: 2.1 10*3/uL (ref 0.80–3.20)
LYMPHOCYTE %: 31 %
MCH: 33.8 pg — ABNORMAL HIGH (ref 27.9–33.1)
MCHC: 35 g/dL (ref 32.8–36.0)
MCHC: 35 g/dL (ref 32.8–36.0)
MCV: 96.6 fL — ABNORMAL HIGH (ref 82.4–95.0)
MONOCYTE #: 0.5 10*3/uL (ref 0.20–0.80)
MONOCYTE %: 8 %
MPV: 8.8 fL (ref 6.0–10.2)
NEUTROPHIL #: 4.1 10*3/uL (ref 1.60–5.50)
NEUTROPHIL %: 59 %
PLATELETS: 191 10*3/uL (ref 140–440)
RBC: 4.45 10*6/uL (ref 4.40–5.68)
RDW: 13.6 % (ref 10.9–15.1)
WBC: 7 10*3/uL (ref 3.3–9.3)

## 2017-10-02 LAB — ECG 12 LEAD - ED USE
Calculated P Axis: 263 deg
Calculated P Axis: INVALID deg
Calculated T Axis: 12 deg
Calculated T Axis: -12 deg
EKG Severity: ABNORMAL
EKG Severity: ABNORMAL
Heart Rate: 136 {beats}/min
Heart Rate: 111 {beats}/min
I 40 Axis: 40 deg
I 40 Axis: 30 deg
I 40 Axis: 30 deg
PR Interval: 97 ms
PR Interval: INVALID ms
QRS Axis: 57 deg
QRS Axis: 49 deg
QRS Duration: 91 ms
QRS Duration: 114 ms
QT Interval: 322 ms
QT Interval: 322 ms
QT Interval: 339 ms
QTC Calculation: 485 ms
QTC Calculation: 461 ms
ST Axis: -81 deg
ST Axis: 269 deg
T 40 Axis: 68 deg
T 40 Axis: 79 deg

## 2017-10-02 LAB — BASIC METABOLIC PANEL
ANION GAP: 7 mmol/L
BUN/CREA RATIO: 23
BUN: 16 mg/dL (ref 10–25)
CALCIUM: 8.9 mg/dL (ref 8.8–10.3)
CHLORIDE: 107 mmol/L (ref 98–111)
CO2 TOTAL: 24 mmol/L (ref 21–35)
CO2 TOTAL: 24 mmol/L (ref 21–35)
CREATININE: 0.71 mg/dL (ref <=1.30)
ESTIMATED GFR: >60 mL/min/{1.73_m2}
GLUCOSE: 109 mg/dL (ref 70–110)
POTASSIUM: 4.2 mmol/L (ref 3.5–5.0)
SODIUM: 138 mmol/L (ref 135–145)

## 2017-10-02 LAB — LIPID PANEL
CHOLESTEROL: 155 mg/dL (ref 0–199)
HDL CHOL: 25 mg/dL — ABNORMAL LOW (ref >40)
LDL DIRECT: 117 mg/dL — ABNORMAL HIGH (ref 0–99)
LDL DIRECT: 117 mg/dL — ABNORMAL HIGH (ref 0–99)
TRIGLYCERIDES: 102 mg/dL (ref 0–199)
VLDL CALC: 20 mg/dL (ref 0–50)

## 2017-10-02 LAB — PT/INR: INR: 1.16 — ABNORMAL HIGH (ref 0.80–1.10)

## 2017-10-02 LAB — MAGNESIUM: MAGNESIUM: 2.1 mg/dL (ref 1.8–2.3)

## 2017-10-02 LAB — TROPONIN-I: TROPONIN I: 0 ng/mL (ref <=0.04)

## 2017-10-02 LAB — PHOSPHORUS: PHOSPHORUS: 3.6 mg/dL (ref 2.5–4.5)

## 2017-10-02 MED ORDER — METOPROLOL SUCCINATE ER 50 MG TABLET,EXTENDED RELEASE 24 HR
50.0000 mg | ORAL_TABLET | Freq: Every day | ORAL | Status: DC
Start: 2017-10-02 — End: 2017-10-03
  Administered 2017-10-02 – 2017-10-03 (×2): 50 mg via ORAL
  Filled 2017-10-02 (×3): qty 1

## 2017-10-02 MED ADMIN — ONDANSETRON/ DEXAMETHASONE IVPB: INTRAVENOUS | @ 20:00:00 | NDC 00143989001

## 2017-10-02 MED ADMIN — sodium chloride 0.9 % (flush) injection syringe: INTRAVENOUS | @ 07:00:00

## 2017-10-02 MED ADMIN — PANTOPRAZOLE 80MG IN NS 100ML INTERMITTENT INFUSION: @ 22:00:00 | NDC 00008092351

## 2017-10-02 NOTE — Care Plan (Signed)
Pt slept overnight denied chest pain, no shortness of breath noted or reported. Pt afib on telemetry hr 90's. Cardizem drip infusing at 5 ml/hour.     I have reviewed this patient's plan of care. Currently this patient meets requirements for a mid to high level of nursing care. The patient will continue to be monitored and reevaluated for any changes.    Frederik Pear, RN

## 2017-10-02 NOTE — Consults (Signed)
Cardiology     CONSULT NOTE      Date of Service:  10/02/2017  [name], 62 y.o. male  Date of Admission:  10/01/2017  Date of Birth:  07/20/1956    Requesting MD: Vivien Presto, MD    Reason for Consult:  Atrial flutter    Assessment/Plan  1. Atrial fib/flutter with rapid ventricular rate.  The patient's rates continue to be 110s to 130s on a Cardizem drip at 10 milligrams.  Dr. Carolan Clines will consider adding a low-dose beta-blocker as blood pressure tolerates.  His chadsVasc score is 1, previously on Eliquis.  This will most likely be restarted upon discharge.  Patient is getting a 2D echo today, will review those images and adjust treatment accordingly.  2. Hypertension.  Currently good control.  Patient is on lisinopril.    3. Will continue monitor the patient's condition closely and adjust treatment accordingly.    Subjective/HPI:  The patient is a 62 year old gentleman who presented to the emergency department chest pain and shortness of breath.  EKG did show rapid AFib/flutter, and he was admitted and Cardiology was consulted for evaluation.  The patient is [name] he is here for work.  He has a history of atrial fibrillation and underwent cardioversion a few years ago, was previously on Eliquis but this was discontinued as he had no recurrence of AFib.  He claims he saw his cardiologist in [name] a few months ago, and he released him from his service as he was doing well.  The patient claims he drove up here from [name] on Sunday, and while traveling he noticed some chest discomfort and shortness of breath but it resolved.  When he woke up yesterday, he had again chest pain, shortness of breath, and fatigue and claims he just did not feel right which prompted him to come to the emergency department.  He denies palpitations, dizziness, syncope.  No other complaints at this time.    Past Medical History:  1. History of atrial fibrillation status post cardioversion in the past.  He  was previously on Eliquis and it was discontinued secondary to no recurrence.  2. Rheumatoid arthritis.  3. Hypertension.    Medications:  Prior to Admission medications    Medication Sig Start Date End Date Taking? Authorizing Provider   dilTIAZem (CARDIZEM) 60 mg Oral Tablet Take 120 mg by mouth Once a day    Yes Provider, Historical   lisinopril (PRINIVIL) 10 mg Oral Tablet Take 20 mg by mouth Once a day    Yes Provider, Historical   methotrexate 2.5 mg tablet Take by mouth Every 7 days    Provider, Historical   venlafaxine (EFFEXOR) 100 mg Oral Tablet Take 100 mg by mouth Once a day    Yes Provider, Historical   vitamin B complex Oral Tablet Take 1 Tab by mouth Once a day   Yes Provider, Historical       Allergies:  Allergies   Allergen Reactions   . Penicillins        Family History:  Significant for premature coronary artery disease in his father who had MI in his 58s and his brother who also had an MI in his 6s.    Social History:  Patient denies tobacco and alcohol use.    Review of systems:  REVIEW OF SYSTEMS: General: no fever, chills, or weight changes. Positive for fatigue.  HEENT: no vision changes. Cardiac: Positive for chest pain, palpitations, dizziness, light-headedness, or  near syncope. Resp: No dyspnea at rest, positive for dyspnea on exertion; no cough or hemoptysis; no orthopnea or PND. GI: No N/V. No melena or bright red blood per rectum. Ext: No edema.Neuro: No focal weakness or numbess.   All other systems were reviewed and negative    Physical Examination:  BP 106/86   Pulse (!) 134   Temp 36.6 C (97.9 F)   Resp 18   Ht 1.88 m (6' 2.02")   Wt 108.9 kg (240 lb)   SpO2 97%   BMI 30.80 kg/m       General:  Well developed, well nourished, in no distress.  HEENT:  No significant abnormalities  Neck:  No JVD, no carotid bruits  Cardiovascular::  Heart tachycardic, irregularly irregular, with no significant murmurs  Lungs:  Clear to auscultation bilaterally  Gastrointestinal::  No  significant abnormalities, soft, nontender  Lymphatic/Extremities:  No significant edema  Neurological:  No significant gross abnormalities  Musculoskeletal: No significant deformities.  Skin: Warm and dry  Psychiatric: Normal mood and affect    EKG:  EKG and telemetry was reviewed and revealed AFib/flutter with rates in the 110s to 130s.    Labs:  Labs were reviewed and reveal:  Troponin x3 negative, white blood cell count 7.0, hemoglobin 15.1, BUN 16, creatinine 0.71, potassium 4.2, magnesium 2.1, TSH normal.    Thank you for the consult.    Othelia Pulling, PA-C   10/02/2017   09:54    -[facility] [place]     Patient is seen examined with PAC as above-agree and verified.  This physician performed history physical exam and outlined treatment evaluation plan.    -reviewing his EKGs and rhythm strips in in discussion with attending physicians, it appears he may certainly have a variant of atrial flutter with 2-1 conduction.  He was recently this morning under better rate control but now is currently tachycardia with ventricular rate of 136 beats per minute.  He is pain-free and hemodynamically stable.  We discussed the some treatment options down the road but now I do agree with systemic anticoagulation for embolic prophylaxis and rate control now adding some beta-blocker and then transition him from IV to oral Cardizem.    On exam is alert and orient normal affect skin warm and dry sclerae conjunctiva nail beds mucosa normal that lesion lungs are clear bilaterally no wheezes rales or rhonchi cardiac exam PMI is nondisplaced tachycardic normal S1 single S2 no S3 and no murmur.    -will institute oral beta-blocker therapy.  Will also increase his Cardizem to 15 milligrams/minute IV.[name], MD  10/02/2017, 12:31

## 2017-10-02 NOTE — Progress Notes (Signed)
Hospitalist Progress Note    [name]       62 y.o.       Date of service: 10/02/2017  Date of Admission:  10/01/2017    Hospital Day:  LOS: 1 day   CC: Atrial flutter (CMS HCC)    Subjective:  Patient seen and examined.  Patient has no complaints.  Heart rate is only minimally improved on Cardizem drip.  He is being started on metoprolol.  Cardiology is seen and evaluated him.      Objective:   Filed Vitals:    10/02/17 0719 10/02/17 0739 10/02/17 0800 10/02/17 1100   BP: (!) 142/101 120/86 106/86 113/76   Pulse: (!) 137 (!) 130 (!) 134 (!) 114   Resp:   18 18   Temp:   36.6 C (97.9 F) 36.8 C (98.2 F)   SpO2:   97% 98%     I have reviewed the vitals.    Input/Output    Intake/Output Summary (Last 24 hours) at 10/02/2017 1356  Last data filed at 10/02/2017 1300  Gross per 24 hour   Intake 1085 ml   Output --   Net 1085 ml    I/O last shift:  01/31 0700 - 01/31 1859  In: 605 [P.O.:480; I.V.:125]  Out: -      Current Facility-Administered Medications:  acetaminophen (TYLENOL) tablet 650 mg Oral Q6H PRN   dilTIAZem (CARDIZEM) 125 mg in NS 125 mL (tot vol) infusion 5 mg/hr Intravenous Continuous   enoxaparin PF (LOVENOX) 120 mg/0.8 mL SubQ injection 1 mg/kg Subcutaneous Q12H   lisinopril (PRINIVIL) tablet 20 mg Oral Daily   metoprolol succinate (TOPROL-XL) 24 hr extended release tablet 50 mg Oral Daily   NS flush syringe 3 mL Intracatheter Q8HRS   NS flush syringe 3 mL Intracatheter Q1H PRN   ondansetron (ZOFRAN) 2 mg/mL injection 4 mg Intravenous Q6H PRN   venlafaxine (EFFEXOR XR) 24 hr extended release capsule 150 mg Oral Daily       Physical Exam:  General:  In bed, alert, oriented, cooperative, no acute distress  Lungs:   Good air entry, clear to auscultation bilaterally  Cardiovascular:   Tachycardic and irregular  Abdomen:   Bowel sounds positive, abdomen soft  Extremities:    no edema, no erythema  Skin:   Warm and dry  Neurologic:   Grossly normal, motor intact    Labs:  Lab Results Today:    Results for  orders placed or performed during the hospital encounter of 10/01/17 (from the past 24 hour(s))   TROPONIN-I   Result Value Ref Range    TROPONIN I 0.01 <=0.04 ng/mL   BASIC METABOLIC PANEL, NON-FASTING   Result Value Ref Range    SODIUM 138 135 - 145 mmol/L    POTASSIUM 4.2 3.5 - 5.0 mmol/L    CHLORIDE 107 98 - 111 mmol/L    CO2 TOTAL 24 21 - 35 mmol/L    ANION GAP 7 mmol/L    CALCIUM 8.9 8.8 - 10.3 mg/dL    GLUCOSE 834 70 - 196 mg/dL    BUN 16 10 - 25 mg/dL    CREATININE 2.22 <=9.79 mg/dL    BUN/CREA RATIO 23     ESTIMATED GFR >60 Avg: 85 mL/min/1.45m^2   MAGNESIUM   Result Value Ref Range    MAGNESIUM 2.1 1.8 - 2.3 mg/dL   PHOSPHORUS   Result Value Ref Range    PHOSPHORUS 3.6 2.5 - 4.5 mg/dL   PT/INR  Result Value Ref Range    INR 1.16 (H) 0.80 - 1.10   TROPONIN-I   Result Value Ref Range    TROPONIN I 0.00 <=0.04 ng/mL   LIPID PANEL   Result Value Ref Range    HDL CHOL 25 (L) >40 mg/dL    TRIGLYCERIDES 885 0 - 199 mg/dL    VLDL CALC 20 0 - 50 mg/dL    CHOLESTEROL 027 0 - 199 mg/dL    LDL DIRECT 741 (H) 0 - 99 mg/dL   CBC WITH DIFF   Result Value Ref Range    WBC 7.0 3.3 - 9.3 x10^3/uL    RBC 4.45 4.40 - 5.68 x10^6/uL    HGB 15.1 13.4 - 17.3 g/dL    HCT 28.7 86.7 - 67.2 %    MCV 96.6 (H) 82.4 - 95.0 fL    MCH 33.8 (H) 27.9 - 33.1 pg    MCHC 35.0 32.8 - 36.0 g/dL    RDW 09.4 70.9 - 62.8 %    PLATELETS 191 140 - 440 x10^3/uL    MPV 8.8 6.0 - 10.2 fL    NEUTROPHIL % 59 %    LYMPHOCYTE % 31 %    MONOCYTE % 8 %    EOSINOPHIL % 2 %    BASOPHIL % 1 %    NEUTROPHIL # 4.10 1.60 - 5.50 x10^3/uL    LYMPHOCYTE # 2.10 0.80 - 3.20 x10^3/uL    MONOCYTE # 0.50 0.20 - 0.80 x10^3/uL    EOSINOPHIL # 0.20 0.00 - 0.50 x10^3/uL    BASOPHIL # 0.10 0.00 - 0.20 x10^3/uL     I have reviewed all labs.    Micro: No results found for any visits on 10/01/17 (from the past 96 hour(s)).    Radiology:       PT/OT: No    Consults:   Cardiology    Assessment/ Plan:   Active Hospital Problems    Diagnosis   . Primary Problem: Atrial flutter (CMS  HCC)  Started on metoprolol  Likely on Eliquis at discharge  Cardiology is following  Lovenox 1 milligram/kilos   . RA (rheumatoid arthritis) (CMS HCC)  Resume methotrexate at discharge     DVT/PE Prophylaxis: Enoxaparin    Disposition Planning: Home discharge      Rondell Reams, MD  10/02/2017, 13:56

## 2017-10-02 NOTE — Care Plan (Signed)
Patient currently on cardizem drip running at 60ml/hr. Heart rate has been in the 120's-130's for most of the day occasionally dropping to 80's. BP stable. No other concerns at this time. Will continue to monitor. I have reviewed this patient's plan of care. Currently this patient meets requirements for a mid to high level of nursing care. The patient will continue to be monitored and reevaluated for any changes.   Minerva Fester, RN

## 2017-10-03 DIAGNOSIS — I495 Sick sinus syndrome: Secondary | ICD-10-CM

## 2017-10-03 MED ORDER — METOPROLOL SUCCINATE ER 50 MG TABLET,EXTENDED RELEASE 24 HR
75.0000 mg | ORAL_TABLET | Freq: Every day | ORAL | Status: DC
Start: 2017-10-04 — End: 2017-10-04
  Administered 2017-10-04: 08:00:00 75 mg via ORAL
  Filled 2017-10-03 (×2): qty 1

## 2017-10-03 MED ORDER — DILTIAZEM CD 120 MG CAPSULE,EXTENDED RELEASE 24 HR
120.00 mg | ORAL_CAPSULE | ORAL | Status: AC
Start: 2017-10-03 — End: 2017-10-03
  Administered 2017-10-03: 120 mg via ORAL
  Filled 2017-10-03: qty 1

## 2017-10-03 MED ORDER — DILTIAZEM CD 120 MG CAPSULE,EXTENDED RELEASE 24 HR
120.0000 mg | ORAL_CAPSULE | Freq: Every day | ORAL | Status: DC
Start: 2017-10-03 — End: 2017-10-03
  Administered 2017-10-03: 120 mg via ORAL
  Filled 2017-10-03: qty 1

## 2017-10-03 MED ORDER — METOPROLOL TARTRATE 25 MG TABLET
25.00 mg | ORAL_TABLET | ORAL | Status: AC
Start: 2017-10-03 — End: 2017-10-03
  Administered 2017-10-03: 25 mg via ORAL
  Filled 2017-10-03: qty 1

## 2017-10-03 MED ORDER — DILTIAZEM CD 180 MG CAPSULE,EXTENDED RELEASE 24 HR
180.00 mg | ORAL_CAPSULE | Freq: Two times a day (BID) | ORAL | Status: DC
Start: 2017-10-04 — End: 2017-10-04
  Administered 2017-10-04 (×2): 180 mg via ORAL
  Filled 2017-10-03 (×5): qty 1
  Filled 2017-10-03: qty 2

## 2017-10-03 MED ORDER — APIXABAN 5 MG TABLET
5.0000 mg | ORAL_TABLET | Freq: Two times a day (BID) | ORAL | 2 refills | Status: DC
Start: 2017-10-03 — End: 2017-10-04

## 2017-10-03 MED ADMIN — sodium chloride 0.9 % (flush) injection syringe: @ 05:00:00

## 2017-10-03 NOTE — Progress Notes (Signed)
Hospitalist Progress Note    [name]       62 y.o.       Date of service: 10/03/2017  Date of Admission:  10/01/2017    Hospital Day:  LOS: 2 days   CC: Atrial flutter (CMS HCC)    Subjective:  Patient seen and examined.  Patient was having pauses over night.  He was much better rate controlled earlier today and the 80s and low 90s.  He unfortunately then started to climb back into the 130s.  Metoprolol is being increased.  The diltiazem is being restarted.    Objective:   Filed Vitals:    10/03/17 0229 10/03/17 0327 10/03/17 0448 10/03/17 0750   BP: 114/60 140/84 130/84 95/71   Pulse: 59 75 90 (!) 131   Resp:    18   Temp:    37.4 C (99.3 F)   SpO2:    99%     I have reviewed the vitals.    Input/Output    Intake/Output Summary (Last 24 hours) at 10/03/2017 1431  Last data filed at 10/03/2017 1200  Gross per 24 hour   Intake 1200 ml   Output 2 ml   Net 1198 ml    I/O last shift:  02/01 0700 - 02/01 1859  In: 720 [P.O.:720]  Out: -        Current Facility-Administered Medications:  acetaminophen (TYLENOL) tablet 650 mg Oral Q6H PRN   dilTIAZem (CARDIZEM CD) 24 hr extended release capsule 120 mg Oral Daily   dilTIAZem (CARDIZEM CD) 24 hr extended release capsule 120 mg Oral Now   enoxaparin PF (LOVENOX) 120 mg/0.8 mL SubQ injection 1 mg/kg Subcutaneous Q12H   lisinopril (PRINIVIL) tablet 20 mg Oral Daily   metoprolol succinate (TOPROL-XL) 24 hr extended release tablet 50 mg Oral Daily   metoprolol tartrate (LOPRESSOR) tablet 25 mg Oral Now   NS flush syringe 3 mL Intracatheter Q8HRS   NS flush syringe 3 mL Intracatheter Q1H PRN   ondansetron (ZOFRAN) 2 mg/mL injection 4 mg Intravenous Q6H PRN   venlafaxine (EFFEXOR XR) 24 hr extended release capsule 150 mg Oral Daily       Physical Exam:  Unchanged  General:  In bed, alert, oriented, cooperative, no acute distress  Lungs:   Good air entry, clear to auscultation bilaterally  Cardiovascular:   Tachycardic and irregular  Abdomen:   Bowel sounds positive, abdomen  soft  Extremities:    no edema, no erythema  Skin:   Warm and dry  Neurologic:   Grossly normal, motor intact    Labs:  Lab Results Today:    No results found for any visits on 10/01/17 (from the past 24 hour(s)).  I have reviewed all labs.    Micro: No results found for any visits on 10/01/17 (from the past 96 hour(s)).    Radiology:       PT/OT: No    Consults:   Cardiology    Assessment/ Plan:   Active Hospital Problems    Diagnosis   . Primary Problem: Atrial flutter (CMS HCC)  Metoprolol increased  Eliquis script was given to case manager for possible discharge tomorrow  Cardiology is following  Lovenox 1 milligram/kilos   . RA (rheumatoid arthritis) (CMS HCC)  Resume methotrexate at discharge     DVT/PE Prophylaxis: Enoxaparin    Disposition Planning: Home discharge      Rondell Reams, MD  10/03/2017, 14:31

## 2017-10-03 NOTE — Consults (Signed)
Cardiology Progress Note          Esker, Dever  Date of Admission:  10/01/2017  Date of service: 10/03/2017  Date of Birth:  Sep 21, 1955  MRN:  [id number]  CSN:  [id number]    Hospital Day:  LOS: 2 days     Subjective:  Case discussed with primary hospitalist attending and with RN.  I discussed the patient's diagnosis, therapy, and further evaluation /treatment options / follow up with the patient.  He is sitting in bed and in report hose feels good.  HE currently denies any chest pain palpitations or dyspnea.  He had some transient episodes of bradycardia and 3+ 2nd pauses earlier.  We have now discontinued IV Cardizem and are up titrating his oral therapy for rate control with calcium blocker -Cardizem and beta-blocker-metoprolol.    ROS:    General-no fever chills or rash  Respiratory - no cough sputum or hemoptysis  Cardiac -see subjective otherwise negative.    Objective:  BP 95/71   Pulse (!) 131   Temp 37.4 C (99.3 F)   Resp 18   Ht 1.88 m (6' 2.02")   Wt 108.9 kg (240 lb)   SpO2 99%   BMI 30.80 kg/m       Telemetry:   Atrial flutter with variable AV conduction currently tachycardia heart rate 110 130 beats per minute.  Heart:  Normal rate and regular rhythm  Lungs:  Clear to auscultation bilaterally  Extremities:  No edema    Labs:   sodium 138 potassium 4.2 BUN 16 creatinine 0.71    Assessment:   1. Atrial fibrillation atrial flutter with variable AV conduction and periods of tachycardia.  Patient also did have some periods of bradycardia with 3+ 2nd pauses.  These were asymptomatic per staff.    2. Systemic hypertension    Plan:   1. Continue AV nodal blocking therapy with both calcium channel blocker and beta-blocker therapy.  Will up titrate his Cardizem to CD 100 80 mg p.o. Q.12 hours and increase Lopressor to 75 mg daily.  Will assess affect.  We would certainly like to try to get him discharge tomorrow if possible.  I did discuss with him offering evaluation and treatment options with the  electrophysiology [facility] after discharge and he may well consider this.      [name], MD     10/03/2017     15:05

## 2017-10-03 NOTE — Care Plan (Signed)
Patient is alert and oriented and in no apparent distress. He denies CP or SOB. He was started on oral cardizem and the cardizem drip was d/c. His metoprolol dose was also increased. Patient HR is aflutter in the 80's. Denies any needs will continue to follow   Su Ley, RN

## 2017-10-03 NOTE — Care Plan (Signed)
Pt is alert and oriented, ambulatory, denies chest pain, no signs of distress.     Cardizem drip stopped at this time. Pt is afib hr 90 on telemetry.     BP 130/84. No complaints at this time.    I have reviewed this patient's plan of care. Currently this patient meets requirements for a mid to high level of nursing care. The patient will continue to be monitored and reevaluated for any changes.    Frederik Pear, RN

## 2017-10-03 NOTE — Nurses Notes (Signed)
Pt had 2.5 sec pause on telemetry, pt is afib hr 72. BP 140/84. Pt denies distress. Dr. Aundria Rud notified. Will continue to monitor.

## 2017-10-03 NOTE — Care Management Notes (Signed)
[facility]  Care Management Note    Patient Name: [name]  Date of Birth: [birth year]  Sex: male  Date/Time of Admission: 10/01/2017 12:25 PM  Room/Bed: 7123/A  Payor: Advertising copywriter / Plan: Investment banker, operational HEALTHCARE CHOICE PLUS / Product Type: Non Managed Care /    LOS: 2 days   Primary Care Providers:  Corrington, Meredith Mody, MD, MD (General)    Admitting Diagnosis:  Atrial flutter (CMS HCC) [I48.92]    Per [facility] / 1580 / the copayment for Rx: Eliquis 5 mg dispense # 60 is $250.00.     Patient has Nurse, learning disability therefore is eligible for manufacturer discount cards: First 30 days free and then $10.00 per month copayment card.     Medication in stock,    Notified Mayra Neer, SW via phone.      Case Manager: Merlyn Albert, RN  Phone: [phone]

## 2017-10-03 NOTE — Care Management Notes (Signed)
[facility]  Care Management Initial Evaluation    Patient Name: [name] [name]  Date of Birth: 03-03-1956  Sex: male  Date/Time of Admission: 10/01/2017 12:25 PM  Room/Bed: 7123/A  Payor: Advertising copywriter / Plan: [employer] CHOICE PLUS / Product Type: Non Managed Care /   PCP: Vivien Presto, MD    Pharmacy Info:   Preferred Pharmacy     None        Emergency Contact Info:   Extended Emergency Contact Information  Primary Emergency Contact: Bearden,ANDREW  Mobile Phone: [phone]  Relation: Relative    History:   [name] [name] is a 62 y.o., male, admitted for Aflutter/AFib    Height/Weight: 188 cm (6' 2.02") / 108.9 kg (240 lb)     LOS: 2 days   Admitting Diagnosis: Atrial flutter (CMS HCC) [I48.92]    Assessment:      10/03/17 1500   Functional Status Current   Ambulation 0 - independent   Transferring 0 - independent   Toileting 0 - independent   Bathing 0 - independent   Dressing 0 - independent   Eating 0 - independent   Communication 0 - understands/communicates without difficulty   Swallowing 0 - swallows foods/liquids without difficulty   Functional Status Prior   Ambulation 0 - independent   Transferring 0 - independent   Toileting 0 - independent   Bathing 0 - independent   Dressing 0 - independent   Eating 0 - independent   Communication 0 - understands/communicates without difficulty   Swallowing 0 - swallows foods/liquids without difficulty   IADL   Medications independent   Meal Preparation independent   Housekeeping independent   Laundry independent   Shopping independent   Oral Care independent       Discharge Plan:  Home (Patient/Family Member/other) (code 1)  Eliquis script obtained and uploaded to [employer].  Requested that Paul-QI investigate copay/prior auth.  Waiting on outcome    The patient will continue to be evaluated for developing discharge needs.     Case Manager: Mayra Neer, SOCIAL WORKER  Phone: [phone]

## 2017-10-03 NOTE — Care Management Notes (Signed)
SW gave patient 30 day free coupon as well as the $10 copay for commercially insured patients.  Patient was agreeable.

## 2017-10-03 NOTE — Nurses Notes (Signed)
[id number] Notified by telemetry tech patient had 2.52 second pause pt asymptomatic no signs of distress. Pt is afib on telemetry.     [id number] Notified by telemetry tech patient had 2.62 sec pause pt is currently afib on telemetry Cardizem gtt is off. No sign of distress at this time.    Hospitalist paged.

## 2017-10-04 MED ORDER — APIXABAN 5 MG TABLET: 5 mg | Tab | Freq: Two times a day (BID) | ORAL | 0 refills | 0 days | Status: AC

## 2017-10-04 MED ORDER — DILTIAZEM CD 180 MG CAPSULE,EXTENDED RELEASE 24 HR: 180 mg | Cap | Freq: Two times a day (BID) | ORAL | 0 refills | 0 days | Status: AC

## 2017-10-04 MED ORDER — APIXABAN 5 MG TABLET
5.00 mg | ORAL_TABLET | Freq: Two times a day (BID) | ORAL | Status: DC
Start: 2017-10-04 — End: 2017-10-04
  Administered 2017-10-04: 5 mg via ORAL
  Filled 2017-10-04 (×4): qty 1

## 2017-10-04 MED ORDER — APIXABAN 5 MG TABLET
5.00 mg | ORAL_TABLET | Freq: Two times a day (BID) | ORAL | 0 refills | Status: DC
Start: 2017-10-04 — End: 2017-10-04

## 2017-10-04 MED ADMIN — morphine 1 mg/mL in 0.9 % sodium chloride intravenous: ORAL | @ 11:00:00 | NDC 09999930851

## 2017-10-04 NOTE — Discharge Summary (Signed)
[facility]  DISCHARGE SUMMARY      PATIENT NAME:  [name] [name]  MRN:  [id number]  DOB:  July 28, 1956      ENCOUNTER START DATE: 10/01/2017  INPATIENT ADMIS SION DATE: 10/01/2017    DISCHARGE DATE:  10/04/2017    ATTENDING PHYSICIAN: No att. providers found  PRIMARY CARE PHYSICIAN: Kip A Corrington, MD     ADMISSION DIAGNOSIS: Atrial flutter (CMS [facility])  Chief Complaint   Patient presents with   . Chest Pain     [facility]pitations. [facility] sent him for abnormal EKG showing a-flutter. 324mg  asa given PTA           DISCHARGE DIAGNOSIS:   Hospital Problems) (* Primary Problem)    Diagnosis Date Noted   . *Atrial flutter (CMS HCC) 10/01/2017   . RA (rheumatoid arthritis) (CMS [facility])       Resolved Hospital Problems   No resolved problems to display.     Active Non-Hospital Problems    Diagnosis Date Noted   . Atrial fibrillation (CMS HCC)       DISCHARGE MEDICATIONS:     Current Discharge Medication List      START taking these medications.      Details   apixaban 5 mg Tablet  Commonly known as:  ELIQUIS   5 mg, Oral, 2 TIMES DAILY  Qty:  60 Tab  Refills:  0     dilTIAZem 180 mg Capsule, Sust. Release 24 hr  Commonly known as:  CARDIZEM CD  Replaces:  dilTIAZem 60 mg Tablet   180 mg, Oral, EVERY 12 HOURS  Qty:  60 Cap  Refills:  0        CONTINUE these medications - NO CHANGES were made during your visit.      Details   lisinopril 10 mg Tablet  Commonly known as:  PRINIVIL   20 mg, Oral, DAILY  Refills:  0     methotrexate 2.5 mg Tablet   Oral, EVERY 7 DAYS  Refills:  0     venlafaxine 100 mg Tablet  Commonly known as:  EFFEXOR   100 mg, Oral, DAILY  Refills:  0     vitamin B complex Tablet   1 Tab, Oral, DAILY  Refills:  0        STOP taking these medications.    dilTIAZem 60 mg Tablet  Commonly known as:  CARDIZEM  Replaced by:  dilTIAZem 180 mg Capsule, Sust. Release 24 hr          DISCHARGE INSTRUCTIONS:      DISCHARGE INSTRUCTION - MISC    Be careful while taking your apixaban (Eliquis)  blood thinning medication.  You will want to trauma such as falls, car wrecks, contact sports, etc.    If your HR is significantly below 60 or above 100 or you have uncomfortable symptoms please call [facility], your primary doctor, or come to the emergency room.    Also, please follow up with electrophysiology (cardiology).  They should be calling you with an appointment, but call them just to confirm.      Take care,  Dr. [place]     Follow-up Information     Corrington, Kip A, MD In 1 week.    Specialty:  Family Practice-[facility]  Contact information:  [address] [place]  [phone]             [phone], [facility]  In 1 week.    Specialty:  Jana Half information:  [facility]  [address] [place] [address]  [phone]                 REASON FOR HOSPITALIZATION AND HOSPITAL COURSE:  This is a 62 y.o., male PMH rheumatoid arthritis and atrial fibrillation, who presented to our facility with chest pain was found to be in atrial fibrillation with RVR.  He was started on diltiazem drip.  Despite having AFib he was not taking apixaban but was previously on it in the past, but was only on enoxaparin during admission.  Then later on, patient did have some pauses and bradycardia.  Cardiology was seen him and which we had him on and put him on a higher dose of his home diltiazem.  He will follow up with electrophysiology at [facility] to assess him if he will require a pacemaker.  He was discharged on apixaban.    DOES PATIENT HAVE ADVANCED DIRECTIVES:  No, Information Offered and Given    CONDITION ON DISCHARGE: Alert, Oriented and VS Stable    DISCHARGE DISPOSITION:  Home discharge     Copies sent to Care Team       Relationship Specialty Notifications Start End    Corrington, Meredith Mody, MD PCP - General Family Practice-[facility]  10/01/17     Phone: [phone] Fax: [phone]         [address] [place] [address]            Madelaine Etienne. Aundria Rud,  MD

## 2017-10-04 NOTE — Nurses Notes (Signed)
Pt is being disscahrged to home. IV removed and intact. Discharge instructions went over with pt and questions naswered. Shaune Pollack, RN  10/04/2017, 14:06

## 2017-10-04 NOTE — Care Plan (Signed)
Pt calm and cooperative with care regimen.  Medications administered as ordered.  No questions or complaints at this time.  I have reviewed this patient's plan of care.  Currently, the patient meets requirements for a low-mid level of nursing care.  The patient will be monitored and re-evaluated for any changes.

## 2017-10-04 NOTE — Progress Notes (Signed)
Hospitalist Progress Note    [name] [name]       62 y.o.       Date of service: 10/04/2017  Date of Admission:  10/01/2017    Hospital Day:  LOS: 3 days   CC: Atrial flutter (CMS HCC)    Subjective:  Pauses this morning and bradycardic.  Improved later in the morning.  Seen by cards and cleared.  Patient feeling well.  No palpitations, CP, SOB, lightheadedness.      Objective:   Filed Vitals:    10/03/17 0448 10/03/17 0750 10/03/17 2030 10/04/17 0750   BP: 130/84 95/71 108/74 110/85   Pulse: 90 (!) 131 85 73   Resp:  18 18 18    Temp:  37.4 C (99.3 F) 36.5 C (97.7 F) 37 C (98.6 F)   SpO2:  99%  99%     I have reviewed the vitals.    Input/Output    Intake/Output Summary (Last 24 hours) at 10/04/2017 1541  Last data filed at 10/04/2017 1200  Gross per 24 hour   Intake 840 ml   Output --   Net 840 ml    I/O last shift:  02/02 0700 - 02/02 1859  In: 600 [P.O.:600]  Out: -        Current Facility-Administered Medications:  acetaminophen (TYLENOL) tablet 650 mg Oral Q6H PRN   apixaban (ELIQUIS) tablet 5 mg Oral 2x/day   dilTIAZem (CARDIZEM CD) 24 hr extended release capsule 180 mg Oral Q12H   lisinopril (PRINIVIL) tablet 20 mg Oral Daily   NS flush syringe 3 mL Intracatheter Q8HRS   NS flush syringe 3 mL Intracatheter Q1H PRN   ondansetron (ZOFRAN) 2 mg/mL injection 4 mg Intravenous Q6H PRN   venlafaxine (EFFEXOR XR) 24 hr extended release capsule 150 mg Oral Daily       Physical Exam:  Unchanged  General:  In bed, alert, oriented, cooperative, no acute distress  Lungs:   Good air entry, clear to auscultation bilaterally  Cardiovascular:   Reg rate, irr rhythm  Abdomen:   Bowel sounds positive, abdomen soft  Extremities:    no edema, no erythema  Skin:   Warm and dry  Neurologic:   Grossly normal, motor intact    Labs:  Lab Results Today:    No results found for any visits on 10/01/17 (from the past 24 hour(s)).  I have reviewed all labs.    Micro: No results found for any visits on 10/01/17 (from the past 96  hour(s)).    Radiology:       PT/OT: No    Consults:   Cardiology    Assessment/ Plan:   Active Hospital Problems    Diagnosis   . Primary Problem: Atrial flutter (CMS HCC)  Cards d/ced metoprolol;   Increased diltiazem  Eliquis script was given to case manager for possible discharge tomorrow  Cardiology is following  Lovenox 1 milligram/kilos   . RA (rheumatoid arthritis) (CMS HCC)  Resume methotrexate at discharge     DVT/PE Prophylaxis: Enoxaparin    Disposition Planning: Home discharge      10/03/17, MD  10/04/2017 15:41

## 2017-10-04 NOTE — Consults (Signed)
Daily Cardiology Progress Note  [name] [name]  10/04/2017  [id number]    Subjective:   CARDIOLOGY CONSULT FOR:  AFib/flutter RVR     Visited and examined patient resting in bed. No acute s/sx of distress.  Reports that breathing is much better now.  No shortness of breath or lower extremity swelling  He denies any further symptoms of dizziness, lightheadedness, syncope, near-syncope.  No new complaints today of chest pain, pressure, heaviness.  He is currently here from [place] and plans to return over the next couple of weeks.    No bleeding issues on enoxaparin 120 mg SQ daily, no abdominal distention or pain, and no blood in stool/urine.     Review of Systems as per HPI.  All other review of systems negative/noncontributory.      Objective:   Filed Vitals:    10/03/17 0448 10/03/17 0750 10/03/17 2030 10/04/17 0750   BP: 130/84 95/71 108/74 110/85   Pulse: 90 (!) 131 85 73   Resp:  18 18 18    Temp:  37.4 C (99.3 F) 36.5 C (97.7 F) 37 C (98.6 F)   SpO2:  99%  99%       Current Facility-Administered Medications:  acetaminophen (TYLENOL) tablet 650 mg Oral Q6H PRN   apixaban (ELIQUIS) tablet 5 mg Oral 2x/day   dilTIAZem (CARDIZEM CD) 24 hr extended release capsule 180 mg Oral Q12H   lisinopril (PRINIVIL) tablet 20 mg Oral Daily   metoprolol succinate (TOPROL-XL) 24 hr extended release tablet 75 mg 75 mg Oral Daily   NS flush syringe 3 mL Intracatheter Q8HRS   NS flush syringe 3 mL Intracatheter Q1H PRN   ondansetron (ZOFRAN) 2 mg/mL injection 4 mg Intravenous Q6H PRN   venlafaxine (EFFEXOR XR) 24 hr extended release capsule 150 mg Oral Daily      Physical Exam   Constitutional: Oriented to person, place, and time. Appears well-developed.   Cardiovascular: Normal rate, irregularly irregular rhythm, S1 normal, S2 normal and normal pulses.  Exam reveals no gallop and no friction rub. No murmur heard.    Respiratory: Effort normal and breath sounds normal. No respiratory distress. No wheezes, rales, rhonchi, or  consolidation. Exhibits no tenderness.   Gastrointestinal: Soft. Bowel sounds are normal. There is no tenderness.   Musculoskeletal: Normal range of motion. Exhibits no edema or tenderness.   Skin: Warm, dry, and intact.  Neurologic: Alert & oriented x 3. No focal findings.  Psychiatric: Normal mood and affect. Behavior is normal.   Nursing note and vitals reviewed.    Basic Metabolic Profile    Lab Results   Component Value Date/Time    SODIUM 138 10/02/2017 03:47 AM    POTASSIUM 4.2 10/02/2017 03:47 AM    CHLORIDE 107 10/02/2017 03:47 AM    CO2 24 10/02/2017 03:47 AM    ANIONGAP 7 10/02/2017 03:47 AM    Lab Results   Component Value Date/Time    BUN 16 10/02/2017 03:47 AM    CREATININE 0.71 10/02/2017 03:47 AM    GLUCOSENF 109 10/02/2017 03:47 AM        Lab Results   Component Value Date    GFR >60 10/02/2017       Assessment/Plan:     Patient Active Problem List    Diagnosis   . Atrial flutter (CMS HCC)   . Atrial fibrillation (CMS HCC)   . RA (rheumatoid arthritis) (CMS HCC)       Cardiac status stable.  AFlutter with controlled  ventricular rate 74 per telemetry and asymptomatic bradycardia with 2-3 second Aflutter pauses.  Patient will be discharged home on apixaban 5 mg twice daily. Will arrange for EP consultation at [facility] in 1-2 weeks.  Patient has agreed to contact his cardiologist in [place] to schedule hospital follow-up.    [name], APRN,FNP-BC  10/04/2017, 08:29  Patient is seen examined with [name] APRN-agree and verified.  This physician performed history and physical examined outlined the treatment evaluation and follow-up plan.    -he is doing well this morning he has no palpitations chest pain or dyspnea and heart rate is well controlled on current regimen heart rate in the 70s to 80s beats per minute.  Will anticoagulate him for embolic prophylaxis with Eliquis and continue current medical therapy.    -we shall arrange follow-up evaluation and treatment with [facility] and go from there.[name], MD  10/04/2017, 09:11

## 2017-10-04 NOTE — Nurses Notes (Signed)
Metoprolol started this morning, Pt brady into the 40s multiple times after given. [name], NP notified. Shaune Pollack, RN  10/04/2017, 10:43

## 2017-10-06 ENCOUNTER — Other Ambulatory Visit (INDEPENDENT_AMBULATORY_CARE_PROVIDER_SITE_OTHER): Payer: Self-pay | Admitting: Nurse Practitioner

## 2017-10-06 DIAGNOSIS — I4892 Unspecified atrial flutter: Secondary | ICD-10-CM

## 2019-05-17 ENCOUNTER — Other Ambulatory Visit: Payer: Self-pay

## 2020-09-07 ENCOUNTER — Other Ambulatory Visit: Payer: Self-pay

## 2020-09-27 ENCOUNTER — Other Ambulatory Visit: Payer: Self-pay

## 2022-01-31 LAB — EXTERNAL GENERIC LAB PROCEDURE: COLOGUARD: POSITIVE — AB

## 2023-04-04 ENCOUNTER — Ambulatory Visit (INDEPENDENT_AMBULATORY_CARE_PROVIDER_SITE_OTHER): Payer: Medicare Other | Admitting: Podiatry

## 2023-04-04 ENCOUNTER — Encounter: Payer: Self-pay | Admitting: Podiatry

## 2023-04-04 DIAGNOSIS — M2041 Other hammer toe(s) (acquired), right foot: Secondary | ICD-10-CM

## 2023-04-04 DIAGNOSIS — L603 Nail dystrophy: Secondary | ICD-10-CM

## 2023-04-04 DIAGNOSIS — M2042 Other hammer toe(s) (acquired), left foot: Secondary | ICD-10-CM

## 2023-04-04 NOTE — Progress Notes (Signed)
  Subjective:  Patient ID: Richard Gentry, male    DOB: 1955-09-18,   MRN: [id number]  Chief Complaint  Patient presents with   Nail Problem    Pt came in for a possible ingrown toenail and a hammertoe, pt stated that he had it for sometime now, it was not bothering him at first but now it is    67 y.o. male presents for concern as above. Relates pain in the toenails that comes and goes. Usually relieved by trimming but lately have been hurting more. Does relates hammertoes. Also has a history of chornic back pain and issues with pains down his legs . Denies any other pedal complaints. Denies n/v/f/c.   Past Medical History:  Diagnosis Date   Arthritis    Chronic back pain    Hypertension     Objective:  Physical Exam: Vascular: DP/PT pulses 2/4 bilateral. CFT <3 seconds. Normal hair growth on digits. No edema.  Skin. No lacerations or abrasions bilateral feet. Nails 1-5 bilateral thickened and dystrophic.  Musculoskeletal: MMT 5/5 bilateral lower extremities in DF, PF, Inversion and Eversion. Deceased ROM in DF of ankle joint. Hammered second digits bilateral. Left reducible. Right second digit is rigid. Tender to distal aspect of bilateral second digits.  Neurological: Sensation intact to light touch.   Assessment:   1. Hammertoe, bilateral   2. Onychodystrophy      Plan:  Patient was evaluated and treated and all questions answered. -Dystrophic nails evaluated and trimmed as courtesy.  -Educated on hammertoes and treatment options  -Discussed padding including toe caps and crest pads.  -Discussed need for potential surgery if pain does not improved.  -Patient to follow-up as needed. Discussed calling if any changes or increased pain.    Louann Sjogren, DPM
